# Patient Record
Sex: Female | Born: 1945 | Race: Black or African American | Hispanic: No | Marital: Married | Smoking: Never smoker
Health system: Southern US, Community
[De-identification: ages and names within clinical notes are randomized; demographics above are authoritative.]

## PROBLEM LIST (undated history)

## (undated) DIAGNOSIS — I10 Essential (primary) hypertension: Secondary | ICD-10-CM

## (undated) DIAGNOSIS — E785 Hyperlipidemia, unspecified: Secondary | ICD-10-CM

## (undated) DIAGNOSIS — R011 Cardiac murmur, unspecified: Secondary | ICD-10-CM

## (undated) DIAGNOSIS — E119 Type 2 diabetes mellitus without complications: Secondary | ICD-10-CM

## (undated) DIAGNOSIS — J45909 Unspecified asthma, uncomplicated: Secondary | ICD-10-CM

## (undated) DIAGNOSIS — K649 Unspecified hemorrhoids: Secondary | ICD-10-CM

## (undated) DIAGNOSIS — M8580 Other specified disorders of bone density and structure, unspecified site: Secondary | ICD-10-CM

## (undated) DIAGNOSIS — N189 Chronic kidney disease, unspecified: Secondary | ICD-10-CM

## (undated) HISTORY — PX: BREAST SURGERY: SHX581

## (undated) HISTORY — PX: BREAST CYST EXCISION: SHX579

## (undated) HISTORY — PX: ABDOMINAL HYSTERECTOMY: SHX81

## (undated) HISTORY — PX: COLONOSCOPY: SHX174

---

## 2005-02-01 ENCOUNTER — Ambulatory Visit: Payer: Self-pay

## 2006-03-13 ENCOUNTER — Ambulatory Visit: Payer: Self-pay | Admitting: Family Medicine

## 2007-01-16 ENCOUNTER — Ambulatory Visit: Payer: Self-pay

## 2008-04-22 ENCOUNTER — Ambulatory Visit: Payer: Self-pay

## 2008-09-08 ENCOUNTER — Ambulatory Visit: Payer: Self-pay | Admitting: Family Medicine

## 2009-02-17 ENCOUNTER — Ambulatory Visit: Payer: Self-pay | Admitting: Family Medicine

## 2009-04-23 ENCOUNTER — Ambulatory Visit: Payer: Self-pay | Admitting: Family Medicine

## 2010-04-25 ENCOUNTER — Ambulatory Visit: Payer: Self-pay | Admitting: Family Medicine

## 2010-05-04 ENCOUNTER — Ambulatory Visit: Payer: Self-pay | Admitting: Family Medicine

## 2010-12-30 ENCOUNTER — Ambulatory Visit: Payer: Self-pay | Admitting: Internal Medicine

## 2011-02-24 ENCOUNTER — Ambulatory Visit: Payer: Self-pay | Admitting: Internal Medicine

## 2011-04-27 ENCOUNTER — Ambulatory Visit: Payer: Self-pay | Admitting: Internal Medicine

## 2011-05-14 ENCOUNTER — Inpatient Hospital Stay: Payer: Self-pay | Admitting: Internal Medicine

## 2011-05-26 ENCOUNTER — Ambulatory Visit: Payer: Self-pay | Admitting: Internal Medicine

## 2012-01-31 ENCOUNTER — Ambulatory Visit: Payer: Self-pay | Admitting: Family Medicine

## 2013-02-05 ENCOUNTER — Ambulatory Visit: Payer: Self-pay | Admitting: Family Medicine

## 2013-05-09 DIAGNOSIS — E669 Obesity, unspecified: Secondary | ICD-10-CM | POA: Insufficient documentation

## 2013-05-09 DIAGNOSIS — J45909 Unspecified asthma, uncomplicated: Secondary | ICD-10-CM | POA: Insufficient documentation

## 2013-05-09 DIAGNOSIS — J309 Allergic rhinitis, unspecified: Secondary | ICD-10-CM | POA: Insufficient documentation

## 2013-05-09 DIAGNOSIS — H209 Unspecified iridocyclitis: Secondary | ICD-10-CM | POA: Insufficient documentation

## 2013-05-09 DIAGNOSIS — N952 Postmenopausal atrophic vaginitis: Secondary | ICD-10-CM | POA: Insufficient documentation

## 2013-05-09 DIAGNOSIS — M8580 Other specified disorders of bone density and structure, unspecified site: Secondary | ICD-10-CM | POA: Insufficient documentation

## 2013-05-29 ENCOUNTER — Ambulatory Visit: Payer: Self-pay | Admitting: Family Medicine

## 2013-06-10 DIAGNOSIS — R7303 Prediabetes: Secondary | ICD-10-CM | POA: Insufficient documentation

## 2013-11-13 ENCOUNTER — Ambulatory Visit: Payer: Self-pay | Admitting: Unknown Physician Specialty

## 2014-02-25 ENCOUNTER — Ambulatory Visit: Payer: Self-pay | Admitting: Family Medicine

## 2014-03-06 DIAGNOSIS — R011 Cardiac murmur, unspecified: Secondary | ICD-10-CM | POA: Insufficient documentation

## 2014-03-10 DIAGNOSIS — J4530 Mild persistent asthma, uncomplicated: Secondary | ICD-10-CM | POA: Insufficient documentation

## 2014-04-07 DIAGNOSIS — K649 Unspecified hemorrhoids: Secondary | ICD-10-CM | POA: Insufficient documentation

## 2015-02-20 ENCOUNTER — Ambulatory Visit: Payer: Self-pay | Admitting: Family Medicine

## 2015-03-08 DIAGNOSIS — E010 Iodine-deficiency related diffuse (endemic) goiter: Secondary | ICD-10-CM | POA: Insufficient documentation

## 2015-03-08 DIAGNOSIS — N183 Chronic kidney disease, stage 3 unspecified: Secondary | ICD-10-CM | POA: Insufficient documentation

## 2015-03-17 ENCOUNTER — Ambulatory Visit: Payer: Self-pay | Admitting: Family Medicine

## 2015-04-07 ENCOUNTER — Ambulatory Visit
Admit: 2015-04-07 | Disposition: A | Discharge status: Home / Self Care | Payer: Self-pay | Attending: Family Medicine | Admitting: Family Medicine

## 2015-09-10 ENCOUNTER — Other Ambulatory Visit: Payer: Self-pay | Admitting: Family Medicine

## 2015-09-10 DIAGNOSIS — M8580 Other specified disorders of bone density and structure, unspecified site: Secondary | ICD-10-CM

## 2016-02-10 ENCOUNTER — Other Ambulatory Visit: Payer: Self-pay | Admitting: Family Medicine

## 2016-02-10 DIAGNOSIS — Z1231 Encounter for screening mammogram for malignant neoplasm of breast: Secondary | ICD-10-CM

## 2016-02-12 ENCOUNTER — Ambulatory Visit
Admission: EM | Admit: 2016-02-12 | Discharge: 2016-02-12 | Disposition: A | Discharge status: Home / Self Care | Payer: Medicare HMO | Attending: Family Medicine | Admitting: Family Medicine

## 2016-02-12 ENCOUNTER — Encounter: Payer: Self-pay | Admitting: Gynecology

## 2016-02-12 DIAGNOSIS — H6992 Unspecified Eustachian tube disorder, left ear: Secondary | ICD-10-CM

## 2016-02-12 DIAGNOSIS — H6982 Other specified disorders of Eustachian tube, left ear: Secondary | ICD-10-CM

## 2016-02-12 DIAGNOSIS — J302 Other seasonal allergic rhinitis: Secondary | ICD-10-CM | POA: Diagnosis not present

## 2016-02-12 HISTORY — DX: Essential (primary) hypertension: I10

## 2016-02-12 HISTORY — DX: Unspecified hemorrhoids: K64.9

## 2016-02-12 HISTORY — DX: Cardiac murmur, unspecified: R01.1

## 2016-02-12 HISTORY — DX: Unspecified asthma, uncomplicated: J45.909

## 2016-02-12 HISTORY — DX: Type 2 diabetes mellitus without complications: E11.9

## 2016-02-12 HISTORY — DX: Other specified disorders of bone density and structure, unspecified site: M85.80

## 2016-02-12 HISTORY — DX: Hyperlipidemia, unspecified: E78.5

## 2016-02-12 MED ORDER — CETIRIZINE-PSEUDOEPHEDRINE ER 5-120 MG PO TB12: 1.0000 | ORAL_TABLET | Freq: Two times a day (BID) | ORAL | Status: DC

## 2016-02-12 NOTE — Discharge Instructions (Signed)
Hay Fever  Hay fever is a type of allergy that people have to things like grass, animals, or pollen from plants and flowers. It cannot be passed from one person to another. You cannot cure hay fever, but there are things that may help relieve your problems (symptoms). HOME CARE  Avoid the things that may be causing your problems.  Take all medicine as told by your doctor. GET HELP RIGHT AWAY IF:  You have asthma, a cough, and you start making whistling sounds when breathing (wheezing).  Your tongue or lips are puffy (swollen).  You have trouble breathing.  You feel lightheaded or like you will pass out (faint).  You have a fever.  Your problems are getting worse and your medicine is not helping.  Your treatment was working, but your problems have come back.  You are stuffed up (congested) and have pressure in your face.  You have a headache.  You have cold sweats. MAKE SURE YOU:  Understand these instructions.  Will watch your condition.  Will get help right away if you are not doing well or get worse.   This information is not intended to replace advice given to you by your health care provider. Make sure you discuss any questions you have with your health care provider.   Document Released: 04/12/2011 Document Revised: 03/04/2012 Document Reviewed: 06/23/2015 Elsevier Interactive Patient Education [employer]  Allergies An allergy is when your body reacts to a substance in a way that is not normal. An allergic reaction can happen after you:  Eat something.  Breathe in something.  Touch something. WHAT KINDS OF ALLERGIES ARE THERE? You can be allergic to:  Things that are only around during certain seasons, like molds and pollens.  Foods.  Drugs.  Insects.  Animal dander. WHAT ARE SYMPTOMS OF ALLERGIES?  Puffiness (swelling). This may happen on the lips, face, tongue, mouth, or throat.  Sneezing.  Coughing.  Breathing loudly  (wheezing).  Stuffy nose.  Tingling in the mouth.  A rash.  Itching.  Itchy, red, puffy areas of skin (hives).  Watery eyes.  Throwing up (vomiting).  Watery poop (diarrhea).  Dizziness.  Feeling faint or fainting.  Trouble breathing or swallowing.  A tight feeling in the chest.  A fast heartbeat. HOW ARE ALLERGIES DIAGNOSED? Allergies can be diagnosed with:  A medical and family history.  Skin tests.  Blood tests.  A food diary. A food diary is a record of all the foods, drinks, and symptoms you have each day.  The results of an elimination diet. This diet involves making sure not to eat certain foods and then seeing what happens when you start eating them again. HOW ARE ALLERGIES TREATED? There is no cure for allergies, but allergic reactions can be treated with medicine. Severe reactions usually need to be treated at a hospital.  HOW CAN REACTIONS BE PREVENTED? The best way to prevent an allergic reaction is to avoid the thing you are allergic to. Allergy shots and medicines can also help prevent reactions in some cases.   This information is not intended to replace advice given to you by your health care provider. Make sure you discuss any questions you have with your health care provider.   Document Released: 04/07/2013 Document Revised: 01/01/2015 Document Reviewed: 09/22/2014 Elsevier Interactive Patient Education 2016 [employer] Barotitis media is inflammation of your middle ear. This occurs when the auditory tube (eustachian tube) leading from the back of your nose (nasopharynx)  to your eardrum is blocked. This blockage may result from a cold, environmental allergies, or an upper respiratory infection. Unresolved barotitis media may lead to damage or hearing loss (barotrauma), which may become permanent. HOME CARE INSTRUCTIONS   Use medicines as recommended by your health care provider. Over-the-counter medicines will help unblock the  canal and can help during times of air travel.  Do not put anything into your ears to clean or unplug them. Eardrops will not be helpful.  Do not swim, dive, or fly until your health care provider says it is all right to do so. If these activities are necessary, chewing gum with frequent, forceful swallowing may help. It is also helpful to hold your nose and gently blow to pop your ears for equalizing pressure changes. This forces air into the eustachian tube.  Only take over-the-counter or prescription medicines for pain, discomfort, or fever as directed by your health care provider.  A decongestant may be helpful in decongesting the middle ear and make pressure equalization easier. SEEK MEDICAL CARE IF:  You experience a serious form of dizziness in which you feel as if the room is spinning and you feel nauseated (vertigo).  Your symptoms only involve one ear. SEEK IMMEDIATE MEDICAL CARE IF:   You develop a severe headache, dizziness, or severe ear pain.  You have bloody or pus-like drainage from your ears.  You develop a fever.  Your problems do not improve or become worse. MAKE SURE YOU:   Understand these instructions.  Will watch your condition.  Will get help right away if you are not doing well or get worse.   This information is not intended to replace advice given to you by your health care provider. Make sure you discuss any questions you have with your health care provider.   Document Released: 12/08/2000 Document Revised: 10/01/2013 Document Reviewed: 07/08/2013 Elsevier Interactive Patient Education [employer]

## 2016-02-12 NOTE — ED Provider Notes (Signed)
CSN: [id number]     Arrival date & time 02/12/16  1323 History   First MD Initiated Contact with Patient 02/12/16 1555    Nurses notes were reviewed. Chief Complaint  Patient presents with  . Otalgia    Patient reports the last 3 days pressure behind left ear. She states that the sinus pain pressure. She's has trouble with allergies was concerned about increased waxing and infection of the left ear. She denies any other problems. She's has no fever. She takes Zyrtec and she has Flonase but hasn't helped that much. She does have a history of hypertension diabetes and cardiac murmur hyperlipidemia. She's had breast surgery abdominal hysterectomy and no stiff Family medical history or problems. Patient does not smoke.   (Consider location/radiation/quality/duration/timing/severity/associated sxs/prior Treatment) Patient is a 70 y.o. female presenting with ear pain. The history is provided by the patient. No language interpreter was used.  Otalgia Location:  Left Quality:  Pressure Severity:  Moderate Onset quality:  Sudden Duration:  3 days Progression:  Worsening Chronicity:  New Context: loud noise   Context: not direct blow, not elevation change and not foreign body in ear   Relieved by:  Nothing Associated symptoms: neck pain     Past Medical History  Diagnosis Date  . Hypertension   . Diabetes mellitus without complication (HCC)   . Asthma   . Osteopenia   . Murmur, cardiac   . Hyperlipidemia   . Hemorrhoids    Past Surgical History  Procedure Laterality Date  . Breast surgery    . Abdominal hysterectomy     No family history on file. Social History  Substance Use Topics  . Smoking status: Never Smoker   . Smokeless tobacco: None  . Alcohol Use: No   OB History    No data available     Review of Systems  HENT: Positive for ear pain.   Musculoskeletal: Positive for neck pain.  All other systems reviewed and are negative.   Allergies  Amlodipine;  Atorvastatin; Penicillins; Rosuvastatin; and Sulfa antibiotics  Home Medications   Prior to Admission medications   Medication Sig Start Date End Date Taking? Authorizing Provider  albuterol (PROVENTIL) (2.5 MG/3ML) 0.083% nebulizer solution Take 2.5 mg by nebulization every 6 (six) hours as needed for wheezing or shortness of breath.   Yes Historical Provider, MD  aspirin 81 MG tablet Take 81 mg by mouth daily.   Yes Historical Provider, MD  beclomethasone (QVAR) 80 MCG/ACT inhaler Inhale into the lungs 2 (two) times daily.   Yes Historical Provider, MD  carboxymethylcellulose (REFRESH PLUS) 0.5 % SOLN 1 drop 3 (three) times daily as needed.   Yes Historical Provider, MD  cetirizine (ZYRTEC) 10 MG tablet Take 10 mg by mouth daily.   Yes Historical Provider, MD  desoximetasone (TOPICORT) 0.25 % cream Apply 1 application topically 2 (two) times daily.   Yes Historical Provider, MD  ferrous sulfate 325 (65 FE) MG EC tablet Take 325 mg by mouth 3 (three) times daily with meals.   Yes Historical Provider, MD  fluticasone (FLONASE) 50 MCG/ACT nasal spray Place into both nostrils daily.   Yes Historical Provider, MD  losartan-hydrochlorothiazide (HYZAAR) 100-12.5 MG tablet Take 1 tablet by mouth daily.   Yes Historical Provider, MD  montelukast (SINGULAIR) 10 MG tablet Take 10 mg by mouth at bedtime.   Yes Historical Provider, MD  Multiple Vitamin (MULTIVITAMIN) capsule Take 1 capsule by mouth daily.   Yes Historical Provider, MD  simvastatin (ZOCOR)  20 MG tablet Take 20 mg by mouth daily.   Yes Historical Provider, MD  tetrahydrozoline (VISINE) 0.05 % ophthalmic solution    Yes Historical Provider, MD   Meds Ordered and Administered this Visit  Medications - No data to display  BP 151/78 mmHg  Pulse 75  Temp(Src) 97.8 F (36.6 C) (Oral)  Resp 18  Ht  (1.6 m)  Wt 214 lb (97.07 kg)  BMI 37.92 kg/m2  SpO2 98% No data found.   Physical Exam  Constitutional: She is oriented to person,  place, and time. She appears well-developed and well-nourished.  HENT:  Head: Atraumatic. Macrocephalic.  Right Ear: Tympanic membrane, external ear and ear canal normal. No decreased hearing is noted.  Left Ear: Hearing, tympanic membrane, external ear and ear canal normal.  Nose: Mucosal edema present. No rhinorrhea. Right sinus exhibits no maxillary sinus tenderness and no frontal sinus tenderness. Left sinus exhibits no maxillary sinus tenderness and no frontal sinus tenderness.  Mouth/Throat: Oropharynx is clear and moist. Normal dentition.  Eyes: Pupils are equal, round, and reactive to light.  Neck: Normal range of motion. Neck supple.  Musculoskeletal: Normal range of motion.  Neurological: She is alert and oriented to person, place, and time. She has normal reflexes.  Skin: Skin is warm and dry. No erythema.  Psychiatric: She has a normal mood and affect.  Vitals reviewed.   ED Course  Procedures (including critical care time)  Labs Review Labs Reviewed - No data to display  Imaging Review No results found.   Visual Acuity Review  Right Eye Distance:   Left Eye Distance:   Bilateral Distance:    Right Eye Near:   Left Eye Near:    Bilateral Near:         MDM  No diagnosis found. Discussed patient about using Valsalva maneuver open up the left ear. We'll switch her from Zyrtec to Zyrtec-D she has Flonase at home but instructed her how to use the Flonase nasal spray in a more efficacious way to help the eustachian tube dysfunction. Informed her she should not be taste the Flonase. Explained to her that if the next step probably be placed on a steroid medication which she really does not want to have.  Note: This dictation was prepared with Dragon dictation along with smaller phrase technology. Any transcriptional errors that result from this process are unintentional.  Hassan Rowan, MD 02/12/16 [id number]

## 2016-02-12 NOTE — ED Notes (Signed)
Patient c/o left ear pain on and off. Patient also stated at times muscle ache.

## 2016-04-04 ENCOUNTER — Other Ambulatory Visit: Payer: Self-pay | Admitting: Family Medicine

## 2016-04-04 DIAGNOSIS — Z1231 Encounter for screening mammogram for malignant neoplasm of breast: Secondary | ICD-10-CM

## 2016-04-11 ENCOUNTER — Ambulatory Visit
Admission: RE | Admit: 2016-04-11 | Discharge: 2016-04-11 | Disposition: A | Discharge status: Home / Self Care | Payer: Medicare HMO | Source: Ambulatory Visit | Attending: Family Medicine | Admitting: Family Medicine

## 2016-04-11 DIAGNOSIS — Z1231 Encounter for screening mammogram for malignant neoplasm of breast: Secondary | ICD-10-CM | POA: Insufficient documentation

## 2016-04-11 DIAGNOSIS — M8580 Other specified disorders of bone density and structure, unspecified site: Secondary | ICD-10-CM

## 2016-04-11 DIAGNOSIS — Z780 Asymptomatic menopausal state: Secondary | ICD-10-CM | POA: Diagnosis not present

## 2016-10-31 ENCOUNTER — Ambulatory Visit
Admission: EM | Admit: 2016-10-31 | Discharge: 2016-10-31 | Disposition: A | Discharge status: Home / Self Care | Payer: Medicare HMO | Attending: Family Medicine | Admitting: Family Medicine

## 2016-10-31 DIAGNOSIS — J40 Bronchitis, not specified as acute or chronic: Secondary | ICD-10-CM

## 2016-10-31 DIAGNOSIS — J0100 Acute maxillary sinusitis, unspecified: Secondary | ICD-10-CM | POA: Diagnosis not present

## 2016-10-31 MED ORDER — DOXYCYCLINE HYCLATE 100 MG PO CAPS: 100.0000 mg | ORAL_CAPSULE | Freq: Two times a day (BID) | ORAL | 0 refills | Status: DC

## 2016-10-31 NOTE — ED Triage Notes (Addendum)
Pt c/o nasal drainage, and cold like symptoms. Coughing up yellow mucus. She was dx with COPD about 5 years ago.

## 2016-10-31 NOTE — Discharge Instructions (Signed)
Take medication as prescribed. Rest. Drink plenty of fluids.  ° °Follow up with your primary care physician this week as needed. Return to Urgent care for new or worsening concerns.  ° °

## 2016-10-31 NOTE — ED Provider Notes (Signed)
[facility] ____________________________________________  Time seen: Approximately 1755 PM  I have reviewed the triage vital signs and the nursing notes.   HISTORY  Chief Complaint Recurrent Sinusitis   HPI Becky Reese is a 70 y.o. female presents for the complaints of 2 weeks of runny nose, nasal congestion, sinus pressure and intermittent cough. Patient reports cough is been more so in the last few days occasionally productive with yellowish mucus. Patient reports has been intermittently using saline nasal rinses that helps some with congestion but no resolution.   Denies known sick contacts. Denies fevers. States still with some sinus pressure around her cheek bones. Denies any dizziness, extremity pain, extremity swelling,vision changes, chest pain, shortness of breath. Denies wheezing. Denies recent antibiotic use. Patient reports feels well otherwise. Reports has continued to remain active.  Reports chronic renal insufficiency and diabetes as well as reports history of COPD. Denies dialysis.  [facility]: PCP   Past Medical History:  Diagnosis Date  . Asthma   . Diabetes mellitus without complication (HCC)   . Hemorrhoids   . Hyperlipidemia   . Hypertension   . Murmur, cardiac   . Osteopenia     There are no active problems to display for this patient.   Past Surgical History:  Procedure Laterality Date  . ABDOMINAL HYSTERECTOMY     PARTIAL  . BREAST CYST EXCISION Right    NEG  . BREAST SURGERY      Current Outpatient Rx  . Order #: [id number] Class: Historical Med  . Order #: [id number] Class: Historical Med  . Order #: [id number] Class: Historical Med  . Order #: [id number] Class: Historical Med  . Order #: [id number] Class: Historical Med  . Order #: [id number] Class: Normal  . Order #: [id number] Class: Historical Med  . Order #: [id number] Class: Historical Med  . Order #: [id number] Class: Historical Med  . Order #: [id number] Class:  Historical Med  . Order #: [id number] Class: Historical Med  . Order #: [id number] Class: Historical Med  . Order #: [id number] Class: Historical Med  . Order #: [id number] Class: Historical Med  . Order #: [id number] Class: Normal    No current facility-administered medications for this encounter.   Current Outpatient Prescriptions:  .  albuterol (PROVENTIL) (2.5 MG/3ML) 0.083% nebulizer solution, Take 2.5 mg by nebulization every 6 (six) hours as needed for wheezing or shortness of breath., Disp: , Rfl:  .  aspirin 81 MG tablet, Take 81 mg by mouth daily., Disp: , Rfl:  .  beclomethasone (QVAR) 80 MCG/ACT inhaler, Inhale into the lungs 2 (two) times daily., Disp: , Rfl:  .  carboxymethylcellulose (REFRESH PLUS) 0.5 % SOLN, 1 drop 3 (three) times daily as needed., Disp: , Rfl:  .  cetirizine (ZYRTEC) 10 MG tablet, Take 10 mg by mouth daily., Disp: , Rfl:  .  cetirizine-pseudoephedrine (ZYRTEC-D) 5-120 MG tablet, Take 1 tablet by mouth 2 (two) times daily., Disp: 60 tablet, Rfl: 0 .  desoximetasone (TOPICORT) 0.25 % cream, Apply 1 application topically 2 (two) times daily., Disp: , Rfl:  .  ferrous sulfate 325 (65 FE) MG EC tablet, Take 325 mg by mouth 3 (three) times daily with meals., Disp: , Rfl:  .  fluticasone (FLONASE) 50 MCG/ACT nasal spray, Place into both nostrils daily., Disp: , Rfl:  .  losartan-hydrochlorothiazide (HYZAAR) 100-12.5 MG tablet, Take 1 tablet by mouth daily., Disp: , Rfl:  .  montelukast (SINGULAIR) 10 MG tablet, Take 10 mg by mouth at bedtime., Disp: , Rfl:  .  Multiple Vitamin (MULTIVITAMIN)  capsule, Take 1 capsule by mouth daily., Disp: , Rfl:  .  simvastatin (ZOCOR) 20 MG tablet, Take 20 mg by mouth daily., Disp: , Rfl:  .  tetrahydrozoline (VISINE) 0.05 % ophthalmic solution, , Disp: , Rfl:  .  doxycycline (VIBRAMYCIN) 100 MG capsule, Take 1 capsule (100 mg total) by mouth 2 (two) times daily., Disp: 20 capsule, Rfl: 0  Allergies Amlodipine; Atorvastatin; Penicillins;  Rosuvastatin; and Sulfa antibiotics  Family History  Problem Relation Age of Onset  . Cancer Mother   . Cancer Father     Social History Social History  Substance Use Topics  . Smoking status: Never Smoker  . Smokeless tobacco: Never Used  . Alcohol use No    Review of Systems Constitutional: No fever/chills Eyes: No visual changes. ENT: No sore throat.As above. Cardiovascular: Denies chest pain. Respiratory: Denies shortness of breath. Gastrointestinal: No abdominal pain.  No nausea, no vomiting.  No diarrhea.  No constipation. Genitourinary: Negative for dysuria. Musculoskeletal: Negative for back pain. Skin: Negative for rash. Neurological: Negative for headaches, focal weakness or numbness.  10-point ROS otherwise negative.  ____________________________________________   PHYSICAL EXAM:  VITAL SIGNS: ED Triage Vitals  Enc Vitals Group     BP 10/31/16 1721 (!) 163/56     Pulse Rate 10/31/16 1721 97     Resp 10/31/16 1721 18     Temp 10/31/16 1721 98 F (36.7 C)     Temp Source 10/31/16 1721 Oral     SpO2 10/31/16 1721 100 %     Weight --      Height --      Head Circumference --      Peak Flow --      Pain Score 10/31/16 1720 5     Pain Loc --      Pain Edu? --      Excl. in GC? --    Constitutional: Alert and oriented. Well appearing and in no acute distress. Eyes: Conjunctivae are normal. PERRL. EOMI. Head: Atraumatic.Mild to moderate tenderness to palpation bilateral maxillary sinuses. No frontal sinus tenderness. No swelling. No erythema.   Ears: no erythema, normal TMs bilaterally.   Nose: nasal congestion with bilateral nasal turbinate erythema and edema.   Mouth/Throat: Mucous membranes are moist.  Oropharynx non-erythematous.No tonsillar swelling or exudate.  Neck: No stridor.  No cervical spine tenderness to palpation. Hematological/Lymphatic/Immunilogical: No cervical lymphadenopathy. Cardiovascular: Normal rate, regular rhythm. Grossly normal  heart sounds.  Good peripheral circulation. Respiratory: Normal respiratory effort.  No retractions. Lungs CTAB. No wheezes, rales or rhonchi. Good air movement. Occasional dry cough noted in room. No focal area of consolidation auscultated. Gastrointestinal: Soft and nontender. No distention.  Musculoskeletal: No lower or upper extremity tenderness nor edema.  Bilateral pedal pulses equal and easily palpated. No cervical, thoracic or lumbar tenderness to palpation.  Neurologic:  Normal speech and language. No gross focal neurologic deficits are appreciated. No gait instability. Skin:  Skin is warm, dry and intact. No rash noted. Psychiatric: Mood and affect are normal. Speech and behavior are normal.  ___________________________________________   LABS (all labs ordered are listed, but only abnormal results are displayed)  Labs Reviewed - No data to display ____________________________________________    PROCEDURES Procedures   INITIAL IMPRESSION / ASSESSMENT AND PLAN / ED COURSE  Pertinent labs & imaging results that were available during my care of the patient were reviewed by me and considered in my medical decision making (see chart for details).  Well-appearing patient. No  acute distress. Suspect maxillary sinusitis and bronchitis. Discussed in detail and encouraged supportive care. Via Epic, last creatinine 1.5 in October 2017. Will treat patient with oral doxycycline. Patient reports she has taken doxycycline in the past and tolerated well.   Discussed follow up with Primary care physician this week. Discussed follow up and return parameters including no resolution or any worsening concerns. Patient verbalized understanding and agreed to plan.   ____________________________________________   FINAL CLINICAL IMPRESSION(S) / ED DIAGNOSES  Final diagnoses:  Acute maxillary sinusitis, recurrence not specified  Bronchitis     Discharge Medication List as of 10/31/2016  5:59  PM    START taking these medications   Details  doxycycline (VIBRAMYCIN) 100 MG capsule Take 1 capsule (100 mg total) by mouth 2 (two) times daily., Starting Tue 10/31/2016, Normal        Note: This dictation was prepared with Dragon dictation along with smaller phrase technology. Any transcriptional errors that result from this process are unintentional.    Clinical Course       Renford Dills, NP 10/31/16 4098

## 2016-11-13 ENCOUNTER — Ambulatory Visit
Admission: EM | Admit: 2016-11-13 | Discharge: 2016-11-13 | Disposition: A | Discharge status: Home / Self Care | Payer: Medicare HMO | Attending: Family Medicine | Admitting: Family Medicine

## 2016-11-13 ENCOUNTER — Ambulatory Visit (INDEPENDENT_AMBULATORY_CARE_PROVIDER_SITE_OTHER): Payer: Medicare HMO

## 2016-11-13 DIAGNOSIS — J0100 Acute maxillary sinusitis, unspecified: Secondary | ICD-10-CM

## 2016-11-13 DIAGNOSIS — J40 Bronchitis, not specified as acute or chronic: Secondary | ICD-10-CM

## 2016-11-13 MED ORDER — LEVOFLOXACIN 500 MG PO TABS: ORAL_TABLET | ORAL | 0 refills | Status: DC

## 2016-11-13 MED ORDER — PREDNISONE 10 MG PO TABS: ORAL_TABLET | ORAL | 0 refills | Status: DC

## 2016-11-13 NOTE — ED Provider Notes (Signed)
[facility] ____________________________________________  Time seen: Approximately 11:17 AM  I have reviewed the triage vital signs and the nursing notes.   HISTORY  Chief Complaint Cough   HPI Becky Reese is a 70 y.o. female presents with complaints of approximately one month cough, runny nose and nasal congestion. Patient reports that she was seen personally 2 weeks ago in urgent care for the same complaints. Patient reports she took oral doxycycline for sinusitis and bronchitis. Patient reports the antibiotic did help and she was feeling better, but reports symptoms have still continue to not fully resolved. Patient reports that her cough is still improved as well as the nasal congestion is still improved. Patient states the cough is primarily at night now. Patient does report some postnasal drainage and continued sinus pressure around her cheeks. Reports really blowing her nose and thick drainage out. States cough is mostly dry cough. Patient reports that her lungs do still feel congested. Patient also does report some intermittent wheezing, more so at night.  Denies fevers. Reports overall continues to remain active. Reports continues to eat and drink well. Denies insect contacts. Patient again states that overall she is feeling better but symptoms are not resolved. Denies chest pain, shortness of breath, just a deep breath, extremity pain, actually swelling, dizziness, weakness or other complaints. Patient reports chronic renal insufficiency. Denies cardiac history.  [facility]: PCP   Past Medical History:  Diagnosis Date  . Asthma   . Diabetes mellitus without complication (HCC)   . Hemorrhoids   . Hyperlipidemia   . Hypertension   . Murmur, cardiac   . Osteopenia     There are no active problems to display for this patient.   Past Surgical History:  Procedure Laterality Date  . ABDOMINAL HYSTERECTOMY     PARTIAL  . BREAST CYST EXCISION  Right    NEG  . BREAST SURGERY      No current facility-administered medications for this encounter.   Current Outpatient Prescriptions:  .  albuterol (PROVENTIL) (2.5 MG/3ML) 0.083% nebulizer solution, Take 2.5 mg by nebulization every 6 (six) hours as needed for wheezing or shortness of breath., Disp: , Rfl:  .  aspirin 81 MG tablet, Take 81 mg by mouth daily., Disp: , Rfl:  .  beclomethasone (QVAR) 80 MCG/ACT inhaler, Inhale into the lungs 2 (two) times daily., Disp: , Rfl:  .  carboxymethylcellulose (REFRESH PLUS) 0.5 % SOLN, 1 drop 3 (three) times daily as needed., Disp: , Rfl:  .  cetirizine (ZYRTEC) 10 MG tablet, Take 10 mg by mouth daily., Disp: , Rfl:  .  cetirizine-pseudoephedrine (ZYRTEC-D) 5-120 MG tablet, Take 1 tablet by mouth 2 (two) times daily., Disp: 60 tablet, Rfl: 0 .  desoximetasone (TOPICORT) 0.25 % cream, Apply 1 application topically 2 (two) times daily., Disp: , Rfl:  .  ferrous sulfate 325 (65 FE) MG EC tablet, Take 325 mg by mouth 3 (three) times daily with meals., Disp: , Rfl:  .  losartan-hydrochlorothiazide (HYZAAR) 100-12.5 MG tablet, Take 1 tablet by mouth daily., Disp: , Rfl:  .  montelukast (SINGULAIR) 10 MG tablet, Take 10 mg by mouth at bedtime., Disp: , Rfl:  .  Multiple Vitamin (MULTIVITAMIN) capsule, Take 1 capsule by mouth daily., Disp: , Rfl:  .  simvastatin (ZOCOR) 20 MG tablet, Take 20 mg by mouth daily., Disp: , Rfl:  .  tetrahydrozoline (VISINE) 0.05 % ophthalmic solution, , Disp: , Rfl:  .  doxycycline (VIBRAMYCIN) 100 MG  capsule, Take 1 capsule (100 mg total) by mouth 2 (two) times daily., Disp: 20 capsule, Rfl: 0 .  fluticasone (FLONASE) 50 MCG/ACT nasal spray, Place into both nostrils daily., Disp: , Rfl:  .  levofloxacin (LEVAQUIN) 500 MG tablet, Take 500 mg tablet orally day one; then 250 mg orally days 2-7., Disp: 4 tablet, Rfl: 0 .  predniSONE (DELTASONE) 10 MG tablet, Start 60 mg po day one, then 50 mg po day two, taper by 10 mg daily  until complete., Disp: 21 tablet, Rfl: 0  Allergies Amlodipine; Atorvastatin; Penicillins; Rosuvastatin; and Sulfa antibiotics  Family History  Problem Relation Age of Onset  . Cancer Mother   . Cancer Father     Social History Social History  Substance Use Topics  . Smoking status: Never Smoker  . Smokeless tobacco: Never Used  . Alcohol use No    Review of Systems Constitutional: No fever/chills Eyes: No visual changes. ENT: No sore throat. As above.  Cardiovascular: Denies chest pain. Respiratory: Denies shortness of breath. Gastrointestinal: No abdominal pain.  No nausea, no vomiting.  No diarrhea.  No constipation. Genitourinary: Negative for dysuria. Musculoskeletal: Negative for back pain. Skin: Negative for rash. Neurological: Negative for headaches, focal weakness or numbness.  10-point ROS otherwise negative.  ____________________________________________   PHYSICAL EXAM:  VITAL SIGNS: ED Triage Vitals  Enc Vitals Group     BP 11/13/16 1056 (!) 109/54     Pulse Rate 11/13/16 1056 72     Resp 11/13/16 1056 17     Temp 11/13/16 1056 97.7 F (36.5 C)     Temp Source 11/13/16 1056 Tympanic     SpO2 11/13/16 1056 100 %     Weight 11/13/16 1052 200 lb (90.7 kg)     Height 11/13/16 1052 5\' 3"  (1.6 m)     Head Circumference --      Peak Flow --      Pain Score 11/13/16 1054 0     Pain Loc --      Pain Edu? --      Excl. in GC? --     Constitutional: Alert and oriented. Well appearing and in no acute distress. Eyes: Conjunctivae are normal. PERRL. EOMI. Head: Atraumatic.Mild tenderness to palpation bilateral maxillary sinuses; no frontal sinus tenderness to palpation. No swelling. No erythema.   Ears: no erythema, normal TMs bilaterally.   Nose: nasal congestion with bilateral nasal turbinate erythema and edema.   Mouth/Throat: Mucous membranes are moist.  Oropharynx non-erythematous.No tonsillar swelling or exudate.  Neck: No stridor.  No cervical  spine tenderness to palpation. Hematological/Lymphatic/Immunilogical: No cervical lymphadenopathy. Cardiovascular: Normal rate, regular rhythm. Grossly normal heart sounds.  Good peripheral circulation. Respiratory: Normal respiratory effort.  No retractions. Lungs CTAB. No wheezes, rales or rhonchi. Good air movement. Dry intermittent cough noted in room, mild bronchospasm wheeze noted with cough.  Gastrointestinal: Soft and nontender. No distention.  Musculoskeletal: No lower or upper extremity tenderness nor edema.  Bilateral pedal pulses equal and easily palpated. No cervical, thoracic or lumbar tenderness to palpation.  Neurologic:  Normal speech and language. No gross focal neurologic deficits are appreciated. No gait instability. Skin:  Skin is warm, dry and intact. No rash noted. Psychiatric: Mood and affect are normal. Speech and behavior are normal.  ___________________________________________   LABS (all labs ordered are listed, but only abnormal results are displayed)  Labs Reviewed - No data to display ____________________________________________  RADIOLOGY  Dg Chest 2 View  Result Date: 11/13/2016 CLINICAL  DATA:  Cough, congestion EXAM: CHEST  2 VIEW COMPARISON:  05/14/2011 FINDINGS: Cardiomediastinal silhouette is stable. No acute infiltrate or pleural effusion. No pulmonary edema. Mild degenerative changes mid thoracic spine. IMPRESSION: No active cardiopulmonary disease. Electronically Signed   By: Natasha Mead M.D.   On: 11/13/2016 11:49   ____________________________________________   PROCEDURES Procedures   INITIAL IMPRESSION / ASSESSMENT AND PLAN / ED COURSE  Pertinent labs & imaging results that were available during my care of the patient were reviewed by me and considered in my medical decision making (see chart for details).  Well-appearing patient. No acute distress. Patient reports has improved but symptoms continue. Suspect continued bronchitis and  sinusitis. However his continued cough will evaluate chest x-ray. Per radiologist's chest x-ray negative. Discussed in detail with patient we'll treat with oral prednisone. Patient does have history of renal insufficiency and last labs reviewed via Epic. Will treat patient with oral Levaquin renal dose, 500 mg day 1 and 250 mg days 2 through 7. Encouraged supportive care. Continue home albuterol inhaler as needed. Encouraged PCP follow up in one week.Discussed indication, risks and benefits of medications with patient.  Discussed follow up with Primary care physician this week. Discussed follow up and return parameters including no resolution or any worsening concerns. Patient verbalized understanding and agreed to plan.   ____________________________________________   FINAL CLINICAL IMPRESSION(S) / ED DIAGNOSES  Final diagnoses:  Bronchitis  Acute maxillary sinusitis, recurrence not specified     Discharge Medication List as of 11/13/2016 12:00 PM    START taking these medications   Details  levofloxacin (LEVAQUIN) 500 MG tablet Take 500 mg tablet orally day one; then 250 mg orally days 2-7., Normal    predniSONE (DELTASONE) 10 MG tablet Start 60 mg po day one, then 50 mg po day two, taper by 10 mg daily until complete., Normal        Note: This dictation was prepared with Dragon dictation along with smaller phrase technology. Any transcriptional errors that result from this process are unintentional.    Clinical Course       Renford Dills, NP 11/13/16 1603

## 2016-11-13 NOTE — Discharge Instructions (Signed)
Take medication as prescribed. Rest. Drink plenty of fluids.  ° °Follow up with your primary care physician this week as needed. Return to Urgent care for new or worsening concerns.  ° °

## 2016-11-13 NOTE — ED Triage Notes (Signed)
Patient complains of cough that started initally over 1 month ago. Patient was seen here on 10/31/2016 and was treated with doxycycline. Patient states that she felt like she was improving but has worsened again. Patient states that she feels like her lungs are weak and would like to know if she should be taking prednisone.

## 2017-02-20 ENCOUNTER — Other Ambulatory Visit: Payer: Self-pay | Admitting: Family Medicine

## 2017-02-20 DIAGNOSIS — Z1231 Encounter for screening mammogram for malignant neoplasm of breast: Secondary | ICD-10-CM

## 2017-04-02 ENCOUNTER — Encounter: Payer: Self-pay | Admitting: Emergency Medicine

## 2017-04-02 ENCOUNTER — Ambulatory Visit
Admission: EM | Admit: 2017-04-02 | Discharge: 2017-04-02 | Disposition: A | Discharge status: Home / Self Care | Payer: Medicare HMO | Attending: Family Medicine | Admitting: Family Medicine

## 2017-04-02 DIAGNOSIS — J45901 Unspecified asthma with (acute) exacerbation: Secondary | ICD-10-CM | POA: Diagnosis not present

## 2017-04-02 DIAGNOSIS — J0100 Acute maxillary sinusitis, unspecified: Secondary | ICD-10-CM

## 2017-04-02 MED ORDER — PREDNISONE 10 MG PO TABS: ORAL_TABLET | ORAL | 0 refills | Status: DC

## 2017-04-02 MED ORDER — DOXYCYCLINE HYCLATE 100 MG PO CAPS: 100.0000 mg | ORAL_CAPSULE | Freq: Two times a day (BID) | ORAL | 0 refills | Status: DC

## 2017-04-02 MED ORDER — IPRATROPIUM-ALBUTEROL 0.5-2.5 (3) MG/3ML IN SOLN
3.0000 mL | Freq: Four times a day (QID) | RESPIRATORY_TRACT | Status: DC
  Administered 2017-04-02: 3 mL via RESPIRATORY_TRACT

## 2017-04-02 NOTE — ED Provider Notes (Signed)
[facility] ____________________________________________  Time seen: Approximately 3:00 PM  I have reviewed the triage vital signs and the nursing notes.   HISTORY  Chief Complaint Cough   HPI Becky Reese is a 71 y.o. female presenting evaluation of cough and chest congestion has been present for the last 3 weeks. Patient reports that initially she felt like she just had a cold, but reports she then felt she had more sinus inflammation and pressure that then causes drainage into her chest. Patient reports that she has been having intermittent wheezing for the last 2 weeks. Patient reports that she has had a chronic history of asthma that flares up often when she has colds, and current symptoms will consistent with asthma as well as previous bronchitis exacerbations. Patient reports that she does still have continued sinus pressure and discomfort around her cheekbones with thick nasal drainage. Reports some thick greenish mucus with cough. Reports has been using home albuterol inhaler with intermittent improvement of cough and wheezing sensation. Denies shortness of breath or chest pain. Reports had some chills at initial symptom onset, denies any other chills, body aches or feeling of fever.  Reports symptoms unresolved with over-the-counter cough and congestion medications. Reports overall feels well except for symptoms have continued. Denies pain at this time, but states sinus pressure sensation. Reports continues to eat and drink well. Denies sore throat.Denies chest pain, shortness of breath, abdominal pain, dysuria, extremity pain, extremity swelling or rash. Denies recent sickness. Denies recent antibiotic use. Patient reports that she does have a chronic renal insufficiency, with creatinine usually around 1.4 or 1.5 per patient.  [facility]: PCP   Past Medical History:  Diagnosis Date  . Asthma   . Diabetes mellitus without complication (HCC)   .  Hemorrhoids   . Hyperlipidemia   . Hypertension   . Murmur, cardiac   . Osteopenia     There are no active problems to display for this patient.   Past Surgical History:  Procedure Laterality Date  . ABDOMINAL HYSTERECTOMY     PARTIAL  . BREAST CYST EXCISION Right    NEG  . BREAST SURGERY       No current facility-administered medications for this encounter.   Current Outpatient Prescriptions:  .  albuterol (PROVENTIL) (2.5 MG/3ML) 0.083% nebulizer solution, Take 2.5 mg by nebulization every 6 (six) hours as needed for wheezing or shortness of breath., Disp: , Rfl:  .  aspirin 81 MG tablet, Take 81 mg by mouth daily., Disp: , Rfl:  .  beclomethasone (QVAR) 80 MCG/ACT inhaler, Inhale into the lungs 2 (two) times daily., Disp: , Rfl:  .  carboxymethylcellulose (REFRESH PLUS) 0.5 % SOLN, 1 drop 3 (three) times daily as needed., Disp: , Rfl:  .  cetirizine (ZYRTEC) 10 MG tablet, Take 10 mg by mouth daily., Disp: , Rfl:  .  cetirizine-pseudoephedrine (ZYRTEC-D) 5-120 MG tablet, Take 1 tablet by mouth 2 (two) times daily., Disp: 60 tablet, Rfl: 0 .  desoximetasone (TOPICORT) 0.25 % cream, Apply 1 application topically 2 (two) times daily., Disp: , Rfl:  .  doxycycline (VIBRAMYCIN) 100 MG capsule, Take 1 capsule (100 mg total) by mouth 2 (two) times daily., Disp: 20 capsule, Rfl: 0 .  doxycycline (VIBRAMYCIN) 100 MG capsule, Take 1 capsule (100 mg total) by mouth 2 (two) times daily., Disp: 20 capsule, Rfl: 0 .  ferrous sulfate 325 (65 FE) MG EC tablet, Take 325 mg by mouth 3 (three) times daily with meals.,  Disp: , Rfl:  .  fluticasone (FLONASE) 50 MCG/ACT nasal spray, Place into both nostrils daily., Disp: , Rfl:  .  losartan-hydrochlorothiazide (HYZAAR) 100-12.5 MG tablet, Take 1 tablet by mouth daily., Disp: , Rfl:  .  montelukast (SINGULAIR) 10 MG tablet, Take 10 mg by mouth at bedtime., Disp: , Rfl:  .  Multiple Vitamin (MULTIVITAMIN) capsule, Take 1 capsule by mouth daily., Disp:  , Rfl:  .  predniSONE (DELTASONE) 10 MG tablet, Start 60 mg po day one, then 50 mg po day two, taper by 10 mg daily until complete., Disp: 21 tablet, Rfl: 0 .  simvastatin (ZOCOR) 20 MG tablet, Take 20 mg by mouth daily., Disp: , Rfl:  .  tetrahydrozoline (VISINE) 0.05 % ophthalmic solution, , Disp: , Rfl:   Allergies Amlodipine; Atorvastatin; Penicillins; Rosuvastatin; and Sulfa antibiotics  Family History  Problem Relation Age of Onset  . Cancer Mother   . Cancer Father     Social History Social History  Substance Use Topics  . Smoking status: Never Smoker  . Smokeless tobacco: Never Used  . Alcohol use No    Review of Systems Constitutional: As above.  ENT: No sore throat. Cardiovascular: Denies chest pain. Respiratory: Denies shortness of breath. Gastrointestinal: No abdominal pain.  No nausea, no vomiting.  No diarrhea.   Genitourinary: Negative for dysuria. Musculoskeletal: Negative for back pain. Skin: Negative for rash.   ____________________________________________   PHYSICAL EXAM:  VITAL SIGNS: ED Triage Vitals  Enc Vitals Group     BP 04/02/17 1430 (!) 149/56     Pulse Rate 04/02/17 1430 67     Resp 04/02/17 1430 16     Temp 04/02/17 1430 98 F (36.7 C)     Temp Source 04/02/17 1430 Oral     SpO2 04/02/17 1430 99 %     Weight 04/02/17 1428 210 lb (95.3 kg)     Height 04/02/17 1428  (1.6 m)     Head Circumference --      Peak Flow --      Pain Score 04/02/17 1428 0     Pain Loc --      Pain Edu? --      Excl. in GC? --    Constitutional: Alert and oriented. Well appearing and in no acute distress. Eyes: Conjunctivae are normal. PERRL. EOMI. Head: Atraumatic.Mild to moderate tenderness to palpation bilateral maxillary sinuses, No frontal sinus tenderness to palpation.. No swelling. No erythema.   Ears: no erythema, normal TMs bilaterally.   Nose: nasal congestion with bilateral nasal turbinate erythema and edema.   Mouth/Throat: Mucous  membranes are moist.  Oropharynx non-erythematous.No tonsillar swelling or exudate.  Neck: No stridor.  No cervical spine tenderness to palpation. Hematological/Lymphatic/Immunilogical: No cervical lymphadenopathy. Cardiovascular: Normal rate, regular rhythm. Grossly normal heart sounds.  Good peripheral circulation. Respiratory: Normal respiratory effort.  No retractions. Mild scattered inspiratory wheezes. No rhonchi. No focal area of consolidation. Dry intermittent cough noted in room. Speaks in complete sentences. Good air movement.  Gastrointestinal: Soft and nontender.  Musculoskeletal: Ambulatory with steady gait. No cervical, thoracic or lumbar tenderness to palpation.  Neurologic:  Normal speech and language. No gait instability. Skin:  Skin is warm, dry and intact. No rash noted. Psychiatric: Mood and affect are normal. Speech and behavior are normal.  ___________________________________________   LABS (all labs ordered are listed, but only abnormal results are displayed)  Labs Reviewed - No data to display  RADIOLOGY  No results found. ____________________________________________  PROCEDURES Procedures     INITIAL IMPRESSION / ASSESSMENT AND PLAN / ED COURSE  Pertinent labs & imaging results that were available during my care of the patient were reviewed by me and considered in my medical decision making (see chart for details).  Well-appearing patient. No acute distress. Albuterol neb given once in urgent care, after neb, reevaluated and wheezes much improved, good air movement and patient reports feeling better. No rhonchi or focal area of consolidation auscultated. Suspect maxillary sinusitis and asthmatic bronchitis. Discussed with patient evaluation and treatment options including evaluation of chest x-ray, patient, chest x-ray at this time. Will treat patient with oral doxycycline, prednisone taper, continue home albuterol inhaler as needed and when necessary  Tessalon Perles in which patient reports she has at home as needed. Encouraged rest, fluids and supportive care. Discussed strict follow-up and return parameters.  Discussed follow up with Primary care physician this week. Discussed follow up and return parameters including no resolution or any worsening concerns. Patient verbalized understanding and agreed to plan.   ____________________________________________   FINAL CLINICAL IMPRESSION(S) / ED DIAGNOSES  Final diagnoses:  Asthmatic bronchitis with acute exacerbation, unspecified asthma severity, unspecified whether persistent  Acute maxillary sinusitis, recurrence not specified     Discharge Medication List as of 04/02/2017  3:28 PM    START taking these medications   Details  !! doxycycline (VIBRAMYCIN) 100 MG capsule Take 1 capsule (100 mg total) by mouth 2 (two) times daily., Starting Mon 04/02/2017, Normal    predniSONE (DELTASONE) 10 MG tablet Start 60 mg po day one, then 50 mg po day two, taper by 10 mg daily until complete., Normal     !! - Potential duplicate medications found. Please discuss with provider.      Note: This dictation was prepared with Dragon dictation along with smaller phrase technology. Any transcriptional errors that result from this process are unintentional.         Renford Dills, NP 04/02/17 2119

## 2017-04-02 NOTE — Discharge Instructions (Signed)
Take medication as prescribed. Rest. Drink plenty of fluids.  ° °Follow up with your primary care physician this week as needed. Return to Urgent care for new or worsening concerns.  ° °

## 2017-04-02 NOTE — ED Triage Notes (Signed)
Patient c/o ongoing cough and chest congestion for 3 weeks.

## 2017-04-12 ENCOUNTER — Ambulatory Visit
Admission: RE | Admit: 2017-04-12 | Discharge: 2017-04-12 | Disposition: A | Discharge status: Home / Self Care | Payer: Medicare HMO | Source: Ambulatory Visit | Attending: Family Medicine | Admitting: Family Medicine

## 2017-04-12 DIAGNOSIS — Z1231 Encounter for screening mammogram for malignant neoplasm of breast: Secondary | ICD-10-CM | POA: Insufficient documentation

## 2017-04-23 ENCOUNTER — Encounter (INDEPENDENT_AMBULATORY_CARE_PROVIDER_SITE_OTHER): Payer: Self-pay | Admitting: Vascular Surgery

## 2017-04-23 ENCOUNTER — Other Ambulatory Visit (INDEPENDENT_AMBULATORY_CARE_PROVIDER_SITE_OTHER): Payer: Self-pay | Admitting: Vascular Surgery

## 2017-04-23 ENCOUNTER — Ambulatory Visit (INDEPENDENT_AMBULATORY_CARE_PROVIDER_SITE_OTHER): Payer: Medicare HMO

## 2017-04-23 ENCOUNTER — Ambulatory Visit (INDEPENDENT_AMBULATORY_CARE_PROVIDER_SITE_OTHER): Payer: Medicare HMO | Admitting: Vascular Surgery

## 2017-04-23 DIAGNOSIS — I6529 Occlusion and stenosis of unspecified carotid artery: Secondary | ICD-10-CM | POA: Insufficient documentation

## 2017-04-23 DIAGNOSIS — J449 Chronic obstructive pulmonary disease, unspecified: Secondary | ICD-10-CM

## 2017-04-23 DIAGNOSIS — I6523 Occlusion and stenosis of bilateral carotid arteries: Secondary | ICD-10-CM | POA: Diagnosis not present

## 2017-04-23 DIAGNOSIS — E782 Mixed hyperlipidemia: Secondary | ICD-10-CM | POA: Diagnosis not present

## 2017-04-23 DIAGNOSIS — E785 Hyperlipidemia, unspecified: Secondary | ICD-10-CM | POA: Insufficient documentation

## 2017-04-23 DIAGNOSIS — I10 Essential (primary) hypertension: Secondary | ICD-10-CM

## 2017-04-23 LAB — VAS US CAROTID
LCCADDIAS: -15 cm/s
LCCADSYS: -129 cm/s
LICADDIAS: -16 cm/s
LICAPSYS: -203 cm/s
Left CCA prox dias: 15 cm/s
Left CCA prox sys: 131 cm/s
Left ICA dist sys: -95 cm/s
Left ICA prox dias: -40 cm/s
RCCADSYS: -118 cm/s
RCCAPSYS: 134 cm/s
RIGHT CCA MID DIAS: -15 cm/s
RIGHT ECA DIAS: -4 cm/s
Right CCA prox dias: 12 cm/s

## 2017-04-23 NOTE — Progress Notes (Signed)
MRN : [id number]  Becky Reese is a 71 y.o. (07/03/1946) female who presents with chief complaint of  Chief Complaint  Patient presents with  . Follow-up  .  History of Present Illness: The patient is seen for follow up evaluation of carotid stenosis. The carotid stenosis followed by ultrasound.   The patient denies amaurosis fugax. There is no recent history of TIA symptoms or focal motor deficits. There is no prior documented CVA.  The patient is taking enteric-coated aspirin 81 mg daily.  There is no history of migraine headaches. There is no history of seizures.  The patient has a history of coronary artery disease, no recent episodes of angina or shortness of breath. The patient denies PAD or claudication symptoms. There is a history of hyperlipidemia which is being treated with a statin.    Carotid Duplex done today shows 40-59% bilateral internal carotid artery stenosis.  No change compared to last study in 04/20/2016.  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  Current Meds  Medication Sig  . albuterol (PROVENTIL) (2.5 MG/3ML) 0.083% nebulizer solution Take 2.5 mg by nebulization every 6 (six) hours as needed for wheezing or shortness of breath.  Marland Kitchen aspirin 81 MG tablet Take 81 mg by mouth daily.  . beclomethasone (QVAR) 80 MCG/ACT inhaler Inhale into the lungs 2 (two) times daily.  . carboxymethylcellulose (REFRESH PLUS) 0.5 % SOLN 1 drop 3 (three) times daily as needed.  . cetirizine (ZYRTEC) 10 MG tablet Take 10 mg by mouth daily.  . cetirizine-pseudoephedrine (ZYRTEC-D) 5-120 MG tablet Take 1 tablet by mouth 2 (two) times daily.  Marland Kitchen desoximetasone (TOPICORT) 0.25 % cream Apply 1 application topically 2 (two) times daily.  . ferrous sulfate 325 (65 FE) MG EC tablet Take 325 mg by mouth 3 (three) times daily with meals.  . fluticasone (FLONASE) 50 MCG/ACT nasal spray Place into  both nostrils daily.  Marland Kitchen losartan-hydrochlorothiazide (HYZAAR) 100-12.5 MG tablet Take 1 tablet by mouth daily.  . montelukast (SINGULAIR) 10 MG tablet Take 10 mg by mouth at bedtime.  . Multiple Vitamin (MULTIVITAMIN) capsule Take 1 capsule by mouth daily.  . simvastatin (ZOCOR) 20 MG tablet Take 20 mg by mouth daily.  Marland Kitchen tetrahydrozoline (VISINE) 0.05 % ophthalmic solution     Past Medical History:  Diagnosis Date  . Asthma   . Diabetes mellitus without complication (HCC)   . Hemorrhoids   . Hyperlipidemia   . Hypertension   . Murmur, cardiac   . Osteopenia     Past Surgical History:  Procedure Laterality Date  . ABDOMINAL HYSTERECTOMY     PARTIAL  . BREAST CYST EXCISION Right    NEG  . BREAST SURGERY      Social History Social History  Substance Use Topics  . Smoking status: Never Smoker  . Smokeless tobacco: Never Used  . Alcohol use No    Family History Family History  Problem Relation Age of Onset  . Cancer Mother   . Cancer Father   . Breast cancer Neg Hx     Allergies  Allergen Reactions  . Amlodipine   . Atorvastatin     Muscle pain   . Penicillins     Hives   . Rosuvastatin     Muscle Pain   . Sulfa Antibiotics Hives     REVIEW OF SYSTEMS (Negative unless checked)  Constitutional: Weight loss  Fever  Chills Cardiac: Chest pain     Chest pressure   Palpitations   Shortness of breath when laying flat   Shortness of breath with exertion. Vascular:  Pain in legs with walking   Pain in legs at rest  History of DVT   Phlebitis   Swelling in legs   Varicose veins   Non-healing ulcers Pulmonary:   Uses home oxygen   Productive cough   Hemoptysis   Wheeze  COPD   Asthma Neurologic:  Dizziness   Seizures   History of stroke   History of TIA  Aphasia   Vissual changes   Weakness or numbness in arm   Weakness or numbness in leg Musculoskeletal:   Joint swelling   Joint pain   Low back  pain Hematologic:  Easy bruising  Easy bleeding   Hypercoagulable state   Anemic Gastrointestinal:  Diarrhea   Vomiting  Gastroesophageal reflux/heartburn   Difficulty swallowing. Genitourinary:  Chronic kidney disease   Difficult urination  Frequent urination   Blood in urine Skin:  Rashes   Ulcers  Psychological:  History of anxiety    History of major depression.  Physical Examination  Vitals:   04/23/17 1102 04/23/17 1104  BP: (!) 174/66 (!) 149/68  Pulse: 66   Resp: 16   Weight: 207 lb (93.9 kg)    Body mass index is 36.67 kg/m. Gen: WD/WN, NAD Head: [place]/AT, No temporalis wasting.  Ear/Nose/Throat: Hearing grossly intact, nares w/o erythema or drainage Eyes: PER, EOMI, sclera nonicteric.  Neck: Supple, no large masses.   Pulmonary:  Good air movement, no audible wheezing bilaterally, no use of accessory muscles.  Cardiac: RRR, no JVD Vascular: Bilateral carotid bruits Vessel Right Left  Radial Palpable Palpable  Ulnar Palpable Palpable  Brachial Palpable Palpable  Carotid Palpable Palpable  Gastrointestinal: Non-distended. No guarding/no peritoneal signs.  Musculoskeletal: M/S 5/5 throughout.  No deformity or atrophy.  Neurologic: CN 2-12 intact. Symmetrical.  Speech is fluent. Motor exam as listed above. Psychiatric: Judgment intact, Mood & affect appropriate for pt's clinical situation. Dermatologic: No rashes or ulcers noted.  No changes consistent with cellulitis. Lymph : No lichenification or skin changes of chronic lymphedema.  CBC No results found for: WBC, HGB, HCT, MCV, PLT  BMET No results found for: NA, K, CL, CO2, GLUCOSE, BUN, CREATININE, CALCIUM, GFRNONAA, GFRAA CrCl cannot be calculated (No order found.).  COAG No results found for: INR, PROTIME  Radiology Mm Digital Screening Bilateral  Result Date: 04/12/2017 CLINICAL DATA:  Screening. EXAM: DIGITAL SCREENING BILATERAL MAMMOGRAM WITH CAD COMPARISON:  Previous  exam(s). ACR Breast Density Category b: There are scattered areas of fibroglandular density. FINDINGS: There are no findings suspicious for malignancy. Images were processed with CAD. IMPRESSION: No mammographic evidence of malignancy. A result letter of this screening mammogram will be mailed directly to the patient. RECOMMENDATION: Screening mammogram in one year. (Code:SM-B-01Y) BI-RADS CATEGORY  1: Negative. Electronically Signed   By: Elberta Fortis M.D.   On: 04/12/2017 14:43    Assessment/Plan 1. Bilateral carotid artery stenosis Recommend:  Given the patient's asymptomatic subcritical stenosis no further invasive testing or surgery at this time.  Carotid Duplex done today shows 40-59% bilateral internal carotid artery stenosis.  No change compared to last study in 04/20/2016.  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  Continue antiplatelet therapy as prescribed Continue management of CAD, HTN and Hyperlipidemia Healthy heart diet,  encouraged exercise at least 4 times per week Follow up in 12  months with duplex ultrasound and physical exam based on  50% stenosis of the bilateral carotid artery   - VAS US CAROTID; Future  2. Chronic obstructive pulmonary disease, unspecified COPD type (HCC) Continue pulmonary medications and aerosols as already ordered, these medications have been reviewed and there are no changes at this time.   3. Essential hypertension Continue antihypertensive medications as already ordered, these medications have been reviewed and there are no changes at this time.   4. Mixed hyperlipidemia Continue statin as ordered and reviewed, no changes at this time    Levora Dredge, MD  04/23/2017 12:55 PM

## 2017-06-21 DIAGNOSIS — T466X5A Adverse effect of antihyperlipidemic and antiarteriosclerotic drugs, initial encounter: Secondary | ICD-10-CM | POA: Insufficient documentation

## 2017-08-12 DIAGNOSIS — I771 Stricture of artery: Secondary | ICD-10-CM | POA: Insufficient documentation

## 2017-08-12 DIAGNOSIS — L309 Dermatitis, unspecified: Secondary | ICD-10-CM | POA: Insufficient documentation

## 2017-08-12 DIAGNOSIS — I6523 Occlusion and stenosis of bilateral carotid arteries: Secondary | ICD-10-CM | POA: Insufficient documentation

## 2017-11-26 ENCOUNTER — Encounter: Payer: Self-pay | Admitting: *Deleted

## 2017-11-26 ENCOUNTER — Ambulatory Visit
Admission: EM | Admit: 2017-11-26 | Discharge: 2017-11-26 | Disposition: A | Discharge status: Home / Self Care | Payer: Medicare HMO | Attending: Family Medicine | Admitting: Family Medicine

## 2017-11-26 DIAGNOSIS — J0100 Acute maxillary sinusitis, unspecified: Secondary | ICD-10-CM | POA: Diagnosis not present

## 2017-11-26 MED ORDER — DOXYCYCLINE HYCLATE 100 MG PO TABS: 100.0000 mg | ORAL_TABLET | Freq: Two times a day (BID) | ORAL | 0 refills | Status: DC

## 2017-11-26 NOTE — ED Triage Notes (Signed)
Productive cough- clear, fever, runny nose and head congestion x 3 days.

## 2017-11-26 NOTE — ED Provider Notes (Signed)
[facility]    CSN: [id number] Arrival date & time: 11/26/17  0903     History   Chief Complaint Chief Complaint  Patient presents with  . Cough  . Nasal Congestion    HPI Becky Reese is a 71 y.o. female.   The history is provided by the patient.  URI  Presenting symptoms: congestion, cough and facial pain   Severity:  Moderate Onset quality:  Sudden Duration:  6 days Timing:  Constant Progression:  Worsening Chronicity:  New Relieved by:  Nothing Ineffective treatments:  OTC medications Associated symptoms: sinus pain   Associated symptoms: no wheezing   Risk factors: being elderly, chronic respiratory disease (asthma) and sick contacts   Risk factors: no immunosuppression and no recent travel     Past Medical History:  Diagnosis Date  . Asthma   . Diabetes mellitus without complication (HCC)   . Hemorrhoids   . Hyperlipidemia   . Hypertension   . Murmur, cardiac   . Osteopenia     Patient Active Problem List   Diagnosis Date Noted  . Carotid stenosis 04/23/2017  . COPD (chronic obstructive pulmonary disease) (HCC) 04/23/2017  . Essential hypertension 04/23/2017  . Hyperlipidemia 04/23/2017    Past Surgical History:  Procedure Laterality Date  . ABDOMINAL HYSTERECTOMY     PARTIAL  . BREAST CYST EXCISION Right    NEG  . BREAST SURGERY      OB History    No data available       Home Medications    Prior to Admission medications   Medication Sig Start Date End Date Taking? Authorizing Provider  aspirin 81 MG tablet Take 81 mg by mouth daily.   Yes [provider]  beclomethasone (QVAR) 80 MCG/ACT inhaler Inhale into the lungs 2 (two) times daily.   Yes [provider]  carboxymethylcellulose (REFRESH PLUS) 0.5 % SOLN 1 drop 3 (three) times daily as needed.   Yes [provider]  cetirizine (ZYRTEC) 10 MG tablet Take 10 mg by mouth daily.   Yes [provider]  cetirizine-pseudoephedrine  (ZYRTEC-D) 5-120 MG tablet Take 1 tablet by mouth 2 (two) times daily. 02/12/16  Yes Hassan Rowan, MD  ezetimibe (ZETIA) 10 MG tablet Take 10 mg by mouth daily.   Yes [provider]  ferrous sulfate 325 (65 FE) MG EC tablet Take 325 mg by mouth 3 (three) times daily with meals.   Yes [provider]  fluticasone (FLONASE) 50 MCG/ACT nasal spray Place into both nostrils daily.   Yes [provider]  Fluticasone-Salmeterol (ADVAIR) 100-50 MCG/DOSE AEPB Inhale 1 puff into the lungs 2 (two) times daily.   Yes [provider]  Multiple Vitamin (MULTIVITAMIN) capsule Take 1 capsule by mouth daily.   Yes [provider]  albuterol (PROVENTIL) (2.5 MG/3ML) 0.083% nebulizer solution Take 2.5 mg by nebulization every 6 (six) hours as needed for wheezing or shortness of breath.    [provider]  desoximetasone (TOPICORT) 0.25 % cream Apply 1 application topically 2 (two) times daily.    [provider]  doxycycline (VIBRA-TABS) 100 MG tablet Take 1 tablet (100 mg total) by mouth 2 (two) times daily. 11/26/17   Payton Mccallum, MD  losartan-hydrochlorothiazide (HYZAAR) 100-12.5 MG tablet Take 1 tablet by mouth daily.    [provider]  montelukast (SINGULAIR) 10 MG tablet Take 10 mg by mouth at bedtime.    [provider]  predniSONE (DELTASONE) 10 MG tablet Start  60 mg po day one, then 50 mg po day two, taper by 10 mg daily until complete. Patient not taking: Reported on 04/23/2017 04/02/17   Renford DillsMiller, Lindsey, NP  simvastatin (ZOCOR) 20 MG tablet Take 20 mg by mouth daily.    [provider]  tetrahydrozoline (VISINE) 0.05 % ophthalmic solution     [provider]    Family History Family History  Problem Relation Age of Onset  . Cancer Mother   . Cancer Father   . Breast cancer Neg Hx     Social History Social History   Tobacco Use  . Smoking status: Never Smoker  . Smokeless tobacco: Never Used    Substance Use Topics  . Alcohol use: No  . Drug use: No     Allergies   Amlodipine; Atorvastatin; Penicillins; Rosuvastatin; and Sulfa antibiotics   Review of Systems Review of Systems  HENT: Positive for congestion and sinus pain.   Respiratory: Positive for cough. Negative for wheezing.      Physical Exam Triage Vital Signs ED Triage Vitals  Enc Vitals Group     BP 11/26/17 0926 (!) 159/59     Pulse Rate 11/26/17 0926 85     Resp 11/26/17 0926 16     Temp 11/26/17 0926 99.1 F (37.3 C)     Temp Source 11/26/17 0926 Oral     SpO2 11/26/17 0926 96 %     Weight 11/26/17 0928 195 lb (88.5 kg)     Height 11/26/17 0928 5\' 3"  (1.6 m)     Head Circumference --      Peak Flow --      Pain Score --      Pain Loc --      Pain Edu? --      Excl. in GC? --    No data found.  Updated Vital Signs BP (!) 159/59 (BP Location: Left Arm)   Pulse 85   Temp 99.1 F (37.3 C) (Oral)   Resp 16   Ht 5\' 3"  (1.6 m)   Wt 195 lb (88.5 kg)   SpO2 96%   BMI 34.54 kg/m   Visual Acuity Right Eye Distance:   Left Eye Distance:   Bilateral Distance:    Right Eye Near:   Left Eye Near:    Bilateral Near:     Physical Exam  Constitutional: She appears well-developed and well-nourished. No distress.  HENT:  Head: Normocephalic and atraumatic.  Right Ear: Tympanic membrane, external ear and ear canal normal.  Left Ear: Tympanic membrane, external ear and ear canal normal.  Nose: Mucosal edema and rhinorrhea present. No nose lacerations, sinus tenderness, nasal deformity, septal deviation or nasal septal hematoma. No epistaxis.  No foreign bodies. Right sinus exhibits maxillary sinus tenderness and frontal sinus tenderness. Left sinus exhibits maxillary sinus tenderness and frontal sinus tenderness.  Mouth/Throat: Uvula is midline, oropharynx is clear and moist and mucous membranes are normal. No oropharyngeal exudate.  Eyes: Conjunctivae and EOM are normal. Pupils are equal, round,  and reactive to light. Right eye exhibits no discharge. Left eye exhibits no discharge. No scleral icterus.  Neck: Normal range of motion. Neck supple. No thyromegaly present.  Cardiovascular: Normal rate, regular rhythm and normal heart sounds.  Pulmonary/Chest: Effort normal and breath sounds normal. No respiratory distress. She has no wheezes. She has no rales.  Lymphadenopathy:    She has no cervical adenopathy.  Skin: She is not diaphoretic.  Nursing note and vitals reviewed.  UC Treatments / Results  Labs (all labs ordered are listed, but only abnormal results are displayed) Labs Reviewed - No data to display  EKG  EKG Interpretation None       Radiology No results found.  Procedures Procedures (including critical care time)  Medications Ordered in UC Medications - No data to display   Initial Impression / Assessment and Plan / UC Course  I have reviewed the triage vital signs and the nursing notes.  Pertinent labs & imaging results that were available during my care of the patient were reviewed by me and considered in my medical decision making (see chart for details).       Final Clinical Impressions(s) / UC Diagnoses   Final diagnoses:  Acute maxillary sinusitis, recurrence not specified    ED Discharge Orders        Ordered    doxycycline (VIBRA-TABS) 100 MG tablet  2 times daily     11/26/17 1011     1.diagnosis reviewed with patient 2. rx as per orders above; reviewed possible side effects, interactions, risks and benefits  3. Recommend supportive treatment with otc meds 4. Follow-up prn if symptoms worsen or don't improve  Controlled Substance Prescriptions [place] Controlled Substance Registry consulted? Not Applicable   Payton Mccallumonty, Nicolette Gieske, MD 11/26/17 1021

## 2017-12-22 ENCOUNTER — Other Ambulatory Visit: Payer: Self-pay

## 2017-12-22 ENCOUNTER — Ambulatory Visit
Admission: EM | Admit: 2017-12-22 | Discharge: 2017-12-22 | Disposition: A | Discharge status: Home / Self Care | Payer: Medicare HMO | Attending: Family Medicine | Admitting: Family Medicine

## 2017-12-22 ENCOUNTER — Encounter: Payer: Self-pay | Admitting: Gynecology

## 2017-12-22 DIAGNOSIS — I10 Essential (primary) hypertension: Secondary | ICD-10-CM

## 2017-12-22 DIAGNOSIS — R030 Elevated blood-pressure reading, without diagnosis of hypertension: Secondary | ICD-10-CM

## 2017-12-22 DIAGNOSIS — J0100 Acute maxillary sinusitis, unspecified: Secondary | ICD-10-CM | POA: Diagnosis not present

## 2017-12-22 MED ORDER — LEVOFLOXACIN 500 MG PO TABS: 500.0000 mg | ORAL_TABLET | Freq: Every day | ORAL | 0 refills | Status: DC

## 2017-12-22 NOTE — ED Triage Notes (Signed)
Patient c/o sinus problem x last night.

## 2017-12-22 NOTE — ED Provider Notes (Signed)
[facility] ____________________________________________  Time seen: Approximately 1242 PM  I have reviewed the triage vital signs and the nursing notes.   HISTORY  Chief Complaint Sinusitis   HPI Becky Reese is a 71 y.o. female presented for evaluation of nasal congestion, nasal drainage and sinus pressure.  Patient reports that she was seen in urgent care about 2 weeks ago for the same complaint was put on oral doxycycline.  States that symptoms did improve, but feels like symptoms never fully resolved as she had continued sinus pressure and congestion.  States over the last 2 days her sinus congestion and pressure sensation has further increased.  States having more postnasal drainage.  Denies known fevers.  States not much of a cough.  Denies fevers, chest pain or shortness of breath, hemoptysis, abdominal pain or other recent changes.  Reports continues to eat and drink well.  Unresolved with her home daily antihistamine and Flonase nasal spray.  Reports recently around family with some similar complaints. Denies recent sickness.  Patient follows with nephrology due to chronic renal disease, patient states last GFR 50.  Rayetta Humphrey, MD: PCP   Past Medical History:  Diagnosis Date  . Asthma   . Diabetes mellitus without complication (HCC)   . Hemorrhoids   . Hyperlipidemia   . Hypertension   . Murmur, cardiac   . Osteopenia     Patient Active Problem List   Diagnosis Date Noted  . Carotid stenosis 04/23/2017  . COPD (chronic obstructive pulmonary disease) (HCC) 04/23/2017  . Essential hypertension 04/23/2017  . Hyperlipidemia 04/23/2017    Past Surgical History:  Procedure Laterality Date  . ABDOMINAL HYSTERECTOMY     PARTIAL  . BREAST CYST EXCISION Right    NEG  . BREAST SURGERY       No current facility-administered medications for this encounter.   Current Outpatient Medications:  .  albuterol (PROVENTIL) (2.5 MG/3ML) 0.083%  nebulizer solution, Take 2.5 mg by nebulization every 6 (six) hours as needed for wheezing or shortness of breath., Disp: , Rfl:  .  aspirin 81 MG tablet, Take 81 mg by mouth daily., Disp: , Rfl:  .  beclomethasone (QVAR) 80 MCG/ACT inhaler, Inhale into the lungs 2 (two) times daily., Disp: , Rfl:  .  carboxymethylcellulose (REFRESH PLUS) 0.5 % SOLN, 1 drop 3 (three) times daily as needed., Disp: , Rfl:  .  carvedilol (COREG) 6.25 MG tablet, Take 6.25 mg by mouth 2 (two) times daily with a meal., Disp: , Rfl:  .  cetirizine (ZYRTEC) 10 MG tablet, Take 10 mg by mouth daily., Disp: , Rfl:  .  cetirizine-pseudoephedrine (ZYRTEC-D) 5-120 MG tablet, Take 1 tablet by mouth 2 (two) times daily., Disp: 60 tablet, Rfl: 0 .  desoximetasone (TOPICORT) 0.25 % cream, Apply 1 application topically 2 (two) times daily., Disp: , Rfl:  .  ezetimibe (ZETIA) 10 MG tablet, Take 10 mg by mouth daily., Disp: , Rfl:  .  ferrous sulfate 325 (65 FE) MG EC tablet, Take 325 mg by mouth 3 (three) times daily with meals., Disp: , Rfl:  .  fluticasone (FLONASE) 50 MCG/ACT nasal spray, Place into both nostrils daily., Disp: , Rfl:  .  Fluticasone-Salmeterol (ADVAIR) 100-50 MCG/DOSE AEPB, Inhale 1 puff into the lungs 2 (two) times daily., Disp: , Rfl:  .  losartan-hydrochlorothiazide (HYZAAR) 100-12.5 MG tablet, Take 1 tablet by mouth daily., Disp: , Rfl:  .  montelukast (SINGULAIR) 10 MG tablet, Take 10 mg by mouth at  bedtime., Disp: , Rfl:  .  Multiple Vitamin (MULTIVITAMIN) capsule, Take 1 capsule by mouth daily., Disp: , Rfl:  .  simvastatin (ZOCOR) 20 MG tablet, Take 20 mg by mouth daily., Disp: , Rfl:  .  levofloxacin (LEVAQUIN) 500 MG tablet, Take 1 tablet (500 mg total) by mouth daily., Disp: 5 tablet, Rfl: 0 .  tetrahydrozoline (VISINE) 0.05 % ophthalmic solution, , Disp: , Rfl:   Allergies Amlodipine; Atorvastatin; Penicillins; Rosuvastatin; and Sulfa antibiotics  Family History  Problem Relation Age of Onset  .  Cancer Mother   . Cancer Father   . Breast cancer Neg Hx     Social History Social History   Tobacco Use  . Smoking status: Never Smoker  . Smokeless tobacco: Never Used  Substance Use Topics  . Alcohol use: No  . Drug use: No    Review of Systems Constitutional: No fever/chills ENT: No sore throat. AS above.  Cardiovascular: Denies chest pain. Respiratory: Denies shortness of breath. Gastrointestinal: No abdominal pain.  Musculoskeletal: Negative for back pain. Skin: Negative for rash.  ____________________________________________   PHYSICAL EXAM:  VITAL SIGNS: ED Triage Vitals  Enc Vitals Group     BP 12/22/17 1127 (!) 187/62     Pulse Rate 12/22/17 1127 64     Resp 12/22/17 1127 16     Temp 12/22/17 1127 98.4 F (36.9 C)     Temp Source 12/22/17 1127 Oral     SpO2 12/22/17 1127 100 %     Weight 12/22/17 1127 195 lb (88.5 kg)     Height 12/22/17 1127 5\' 3"  (1.6 m)     Head Circumference --      Peak Flow --      Pain Score 12/22/17 1131 3     Pain Loc --      Pain Edu? --      Excl. in GC? --     Constitutional: Alert and oriented. Well appearing and in no acute distress. Eyes: Conjunctivae are normal.  Head: Atraumatic.Mild to moderate tenderness to palpation bilateral frontal and maxillary sinuses. No swelling. No erythema.   Ears: no erythema, normal TMs bilaterally.   Nose: nasal congestion with bilateral nasal turbinate erythema and edema.   Mouth/Throat: Mucous membranes are moist.  Oropharynx non-erythematous.No tonsillar swelling or exudate.  Postnasal drainage present. Neck: No stridor.  No cervical spine tenderness to palpation. Hematological/Lymphatic/Immunilogical: No cervical lymphadenopathy. Cardiovascular: Normal rate, regular rhythm. Grossly normal heart sounds.  Good peripheral circulation. Respiratory: Normal respiratory effort.  No retractions.No wheezes, rales or rhonchi. Good air movement.  Musculoskeletal: No cervical, thoracic or  lumbar tenderness to palpation.  Neurologic:  Normal speech and language. No gross focal neurologic deficits are appreciated. No gait instability.  No paresthesias. Skin:  Skin is warm, dry and intact. No rash noted. Psychiatric: Mood and affect are normal. Speech and behavior are normal.  ___________________________________________   LABS (all labs ordered are listed, but only abnormal results are displayed)  Labs Reviewed - No data to display ____________________________________________  PROCEDURES Procedures   INITIAL IMPRESSION / ASSESSMENT AND PLAN / ED COURSE  Pertinent labs & imaging results that were available during my care of the patient were reviewed by me and considered in my medical decision making (see chart for details).  Well-appearing patient.  No acute distress.  Concern for unresolved continued sinusitis complaints.  Patient chronic renal insufficiency, highly allergic to penicillin, recently treated with oral doxycycline.  Last creatinine via care everywhere 1.5.  Will  treat patient with oral Levaquin.  Continue home antihistamine and daily Flonase.  Encourage rest, fluids, supportive care.  Patient also hypertensive at urgent care visit, patient states that she forgot to take her carvedilol this morning, and will take her medications as prescribed tonight and continue from there.  Patient states that she has been monitoring her blood pressure at home and usually 130/70.  Counseled regarding close monitoring.Discussed indication, risks and benefits of medications with patient.  Discussed follow up with Primary care physician this week. Discussed follow up and return parameters including no resolution or any worsening concerns. Patient verbalized understanding and agreed to plan.   ____________________________________________   FINAL CLINICAL IMPRESSION(S) / ED DIAGNOSES  Final diagnoses:  Acute maxillary sinusitis, recurrence not specified  Elevated blood pressure  reading     ED Discharge Orders        Ordered    levofloxacin (LEVAQUIN) 500 MG tablet  Daily     12/22/17 1259       Note: This dictation was prepared with Dragon dictation along with smaller phrase technology. Any transcriptional errors that result from this process are unintentional.         Renford DillsMiller, Azzure Garabedian, NP 12/22/17 1351

## 2017-12-22 NOTE — Discharge Instructions (Signed)
Take medication as prescribed. Rest. Drink plenty of fluids.  ° °Follow up with your primary care physician this week as needed. Return to Urgent care for new or worsening concerns.  ° °

## 2018-03-19 ENCOUNTER — Other Ambulatory Visit: Payer: Self-pay | Admitting: Family Medicine

## 2018-03-19 DIAGNOSIS — Z1231 Encounter for screening mammogram for malignant neoplasm of breast: Secondary | ICD-10-CM

## 2018-04-15 ENCOUNTER — Ambulatory Visit
Admission: RE | Admit: 2018-04-15 | Discharge: 2018-04-15 | Disposition: A | Discharge status: Home / Self Care | Payer: Medicare HMO | Source: Ambulatory Visit | Attending: Family Medicine | Admitting: Family Medicine

## 2018-04-15 DIAGNOSIS — Z1231 Encounter for screening mammogram for malignant neoplasm of breast: Secondary | ICD-10-CM | POA: Insufficient documentation

## 2018-04-22 ENCOUNTER — Encounter (INDEPENDENT_AMBULATORY_CARE_PROVIDER_SITE_OTHER): Payer: Self-pay | Admitting: Vascular Surgery

## 2018-04-22 ENCOUNTER — Ambulatory Visit (INDEPENDENT_AMBULATORY_CARE_PROVIDER_SITE_OTHER): Payer: Medicare HMO | Admitting: Vascular Surgery

## 2018-04-22 ENCOUNTER — Ambulatory Visit (INDEPENDENT_AMBULATORY_CARE_PROVIDER_SITE_OTHER): Payer: Medicare HMO

## 2018-04-22 VITALS — BP 190/71 | HR 73 | Resp 17 | Ht 63.0 in | Wt 197.0 lb

## 2018-04-22 DIAGNOSIS — E782 Mixed hyperlipidemia: Secondary | ICD-10-CM

## 2018-04-22 DIAGNOSIS — I6523 Occlusion and stenosis of bilateral carotid arteries: Secondary | ICD-10-CM

## 2018-04-22 DIAGNOSIS — J449 Chronic obstructive pulmonary disease, unspecified: Secondary | ICD-10-CM

## 2018-04-22 DIAGNOSIS — I10 Essential (primary) hypertension: Secondary | ICD-10-CM | POA: Diagnosis not present

## 2018-04-22 NOTE — Progress Notes (Signed)
MRN : [id number]  Becky Reese is a 72 y.o. (Oct 02, 1946) female who presents with chief complaint of No chief complaint on file. Marland Kitchen  History of Present Illness:   The patient is seen for follow up evaluation of carotid stenosis. The carotid stenosis followed by ultrasound.   The patient denies amaurosis fugax. There is no recent history of TIA symptoms or focal motor deficits. There is no prior documented CVA.  The patient is taking enteric-coated aspirin 81 mg daily.  There is no history of migraine headaches. There is no history of seizures.  The patient has a history of coronary artery disease, no recent episodes of angina or shortness of breath. The patient denies PAD or claudication symptoms. There is a history of hyperlipidemia which is being treated with a statin.   Carotid Duplex done today shows 40-59% bilateral internal carotid artery stenosis.  No change compared to last study in 04/23/2017.  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     No outpatient medications have been marked as taking for the 04/22/18 encounter (Appointment) with Gilda Crease, Latina Craver, MD.    Past Medical History:  Diagnosis Date  . Asthma   . Diabetes mellitus without complication (HCC)   . Hemorrhoids   . Hyperlipidemia   . Hypertension   . Murmur, cardiac   . Osteopenia     Past Surgical History:  Procedure Laterality Date  . ABDOMINAL HYSTERECTOMY     PARTIAL  . BREAST CYST EXCISION Right    NEG  . BREAST SURGERY      Social History Social History   Tobacco Use  . Smoking status: Never Smoker  . Smokeless tobacco: Never Used  Substance Use Topics  . Alcohol use: No  . Drug use: No    Family History Family History  Problem Relation Age of Onset  . Cancer Mother   . Cancer Father   . Breast cancer Neg Hx     Allergies  Allergen Reactions  . Amlodipine   .  Atorvastatin     Muscle pain   . Penicillins     Hives   . Rosuvastatin     Muscle Pain   . Sulfa Antibiotics Hives     REVIEW OF SYSTEMS (Negative unless checked)  Constitutional: Weight loss  Fever  Chills Cardiac: Chest pain   Chest pressure   Palpitations   Shortness of breath when laying flat   Shortness of breath with exertion. Vascular:  Pain in legs with walking   Pain in legs at rest  History of DVT   Phlebitis   Swelling in legs   Varicose veins   Non-healing ulcers Pulmonary:   Uses home oxygen   Productive cough   Hemoptysis   Wheeze  COPD   Asthma Neurologic:  Dizziness   Seizures   History of stroke   History of TIA  Aphasia   Vissual changes   Weakness or numbness in arm   Weakness or numbness in leg Musculoskeletal:   Joint swelling   Joint pain   Low back pain Hematologic:  Easy bruising  Easy bleeding   Hypercoagulable state   Anemic Gastrointestinal:  Diarrhea   Vomiting  Gastroesophageal reflux/heartburn   Difficulty swallowing. Genitourinary:  Chronic kidney disease   Difficult urination  Frequent urination   Blood in urine Skin:  Rashes   Ulcers  Psychological:  History of anxiety    History of  major depression.  Physical Examination  There were no vitals filed for this visit. There is no height or weight on file to calculate BMI. Gen: WD/WN, NAD Head: /AT, No temporalis wasting.  Ear/Nose/Throat: Hearing grossly intact, nares w/o erythema or drainage Eyes: PER, EOMI, sclera nonicteric.  Neck: Supple, no large masses.   Pulmonary:  Good air movement, no audible wheezing bilaterally, no use of accessory muscles.  Cardiac: RRR, no JVD Vascular: bilateral carotid bruits. Trace edema of both ankles Vessel Right Left  Radial Palpable Palpable  Ulnar Palpable Palpable  Brachial Palpable Palpable  Carotid Palpable Palpable  Gastrointestinal: Non-distended. No  guarding/no peritoneal signs.  Musculoskeletal: M/S 5/5 throughout.  No deformity or atrophy.  Neurologic: CN 2-12 intact. Symmetrical.  Speech is fluent. Motor exam as listed above. Psychiatric: Judgment intact, Mood & affect appropriate for pt's clinical situation. Dermatologic: No rashes or ulcers noted.  No changes consistent with cellulitis. Lymph : No lichenification or skin changes of chronic lymphedema.  CBC No results found for: WBC, HGB, HCT, MCV, PLT  BMET No results found for: NA, K, CL, CO2, GLUCOSE, BUN, CREATININE, CALCIUM, GFRNONAA, GFRAA CrCl cannot be calculated (No order found.).  COAG No results found for: INR, PROTIME  Radiology Mm Screening Breast Tomo Bilateral  Result Date: 04/15/2018 CLINICAL DATA:  Screening. EXAM: DIGITAL SCREENING BILATERAL MAMMOGRAM WITH TOMO AND CAD COMPARISON:  Previous exam(s). ACR Breast Density Category b: There are scattered areas of fibroglandular density. FINDINGS: There are no findings suspicious for malignancy. Images were processed with CAD. IMPRESSION: No mammographic evidence of malignancy. A result letter of this screening mammogram will be mailed directly to the patient. RECOMMENDATION: Screening mammogram in one year. (Code:SM-B-01Y) BI-RADS CATEGORY  1: Negative. Electronically Signed   By: Amie Portland M.D.   On: 04/15/2018 12:48     Assessment/Plan 1. Bilateral carotid artery stenosis Recommend:  Given the patient's asymptomatic subcritical stenosis no further invasive testing or surgery at this time.  Carotid Duplex done today shows 40-59% bilateral internal carotid artery stenosis.  No change compared to last study in 04/20/2016.  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  Continue antiplatelet therapy as prescribed Continue management of CAD, HTN and Hyperlipidemia Healthy heart diet,  encouraged exercise at least 4 times per  week Follow up in 12 months with duplex ultrasound and physical exam based on  50% stenosis of the bilateral carotid artery   - VAS US CAROTID; Future   2. Essential hypertension Continue antihypertensive medications as already ordered, these medications have been reviewed and there are no changes at this time.   3. Chronic obstructive pulmonary disease, unspecified COPD type (HCC) Continue pulmonary medications and aerosols as already ordered, these medications have been reviewed and there are no changes at this time.   4. Mixed hyperlipidemia Continue statin as ordered and reviewed, no changes at this time     Levora Dredge, MD  04/22/2018 1:31 PM

## 2018-04-28 ENCOUNTER — Encounter (INDEPENDENT_AMBULATORY_CARE_PROVIDER_SITE_OTHER): Payer: Self-pay | Admitting: Vascular Surgery

## 2019-01-28 ENCOUNTER — Encounter: Payer: Self-pay | Admitting: Emergency Medicine

## 2019-01-29 ENCOUNTER — Ambulatory Visit: Payer: Medicare HMO | Admitting: Anesthesiology

## 2019-01-29 ENCOUNTER — Encounter
Admission: RE | Disposition: A | Discharge status: Home / Self Care | Payer: Self-pay | Source: Home / Self Care | Attending: Unknown Physician Specialty

## 2019-01-29 ENCOUNTER — Ambulatory Visit
Admission: RE | Admit: 2019-01-29 | Discharge: 2019-01-29 | Disposition: A | Discharge status: Home / Self Care | Payer: Medicare HMO | Attending: Unknown Physician Specialty | Admitting: Unknown Physician Specialty

## 2019-01-29 DIAGNOSIS — J449 Chronic obstructive pulmonary disease, unspecified: Secondary | ICD-10-CM | POA: Insufficient documentation

## 2019-01-29 DIAGNOSIS — K219 Gastro-esophageal reflux disease without esophagitis: Secondary | ICD-10-CM | POA: Diagnosis not present

## 2019-01-29 DIAGNOSIS — N189 Chronic kidney disease, unspecified: Secondary | ICD-10-CM | POA: Diagnosis not present

## 2019-01-29 DIAGNOSIS — Z7982 Long term (current) use of aspirin: Secondary | ICD-10-CM | POA: Diagnosis not present

## 2019-01-29 DIAGNOSIS — Z888 Allergy status to other drugs, medicaments and biological substances status: Secondary | ICD-10-CM | POA: Insufficient documentation

## 2019-01-29 DIAGNOSIS — Z86010 Personal history of colonic polyps: Secondary | ICD-10-CM | POA: Insufficient documentation

## 2019-01-29 DIAGNOSIS — R1013 Epigastric pain: Secondary | ICD-10-CM | POA: Insufficient documentation

## 2019-01-29 DIAGNOSIS — J45909 Unspecified asthma, uncomplicated: Secondary | ICD-10-CM | POA: Diagnosis not present

## 2019-01-29 DIAGNOSIS — K640 First degree hemorrhoids: Secondary | ICD-10-CM | POA: Insufficient documentation

## 2019-01-29 DIAGNOSIS — D509 Iron deficiency anemia, unspecified: Secondary | ICD-10-CM | POA: Diagnosis present

## 2019-01-29 DIAGNOSIS — K319 Disease of stomach and duodenum, unspecified: Secondary | ICD-10-CM | POA: Insufficient documentation

## 2019-01-29 DIAGNOSIS — I129 Hypertensive chronic kidney disease with stage 1 through stage 4 chronic kidney disease, or unspecified chronic kidney disease: Secondary | ICD-10-CM | POA: Diagnosis not present

## 2019-01-29 DIAGNOSIS — Z882 Allergy status to sulfonamides status: Secondary | ICD-10-CM | POA: Insufficient documentation

## 2019-01-29 DIAGNOSIS — E785 Hyperlipidemia, unspecified: Secondary | ICD-10-CM | POA: Insufficient documentation

## 2019-01-29 DIAGNOSIS — Z1211 Encounter for screening for malignant neoplasm of colon: Secondary | ICD-10-CM | POA: Diagnosis not present

## 2019-01-29 DIAGNOSIS — Z79899 Other long term (current) drug therapy: Secondary | ICD-10-CM | POA: Insufficient documentation

## 2019-01-29 DIAGNOSIS — E1122 Type 2 diabetes mellitus with diabetic chronic kidney disease: Secondary | ICD-10-CM | POA: Diagnosis not present

## 2019-01-29 DIAGNOSIS — M8580 Other specified disorders of bone density and structure, unspecified site: Secondary | ICD-10-CM | POA: Insufficient documentation

## 2019-01-29 DIAGNOSIS — Z7951 Long term (current) use of inhaled steroids: Secondary | ICD-10-CM | POA: Insufficient documentation

## 2019-01-29 DIAGNOSIS — Z880 Allergy status to penicillin: Secondary | ICD-10-CM | POA: Diagnosis not present

## 2019-01-29 HISTORY — PX: COLONOSCOPY WITH PROPOFOL: SHX5780

## 2019-01-29 HISTORY — PX: ESOPHAGOGASTRODUODENOSCOPY (EGD) WITH PROPOFOL: SHX5813

## 2019-01-29 HISTORY — DX: Chronic kidney disease, unspecified: N18.9

## 2019-01-29 SURGERY — COLONOSCOPY WITH PROPOFOL
Anesthesia: General

## 2019-01-29 MED ORDER — PROPOFOL 500 MG/50ML IV EMUL
INTRAVENOUS | Status: AC
  Filled 2019-01-29: qty 50

## 2019-01-29 MED ORDER — FENTANYL CITRATE (PF) 100 MCG/2ML IJ SOLN
INTRAMUSCULAR | Status: AC
  Filled 2019-01-29: qty 2

## 2019-01-29 MED ORDER — PROPOFOL 500 MG/50ML IV EMUL
INTRAVENOUS | Status: DC | PRN
  Administered 2019-01-29: 180 ug/kg/min via INTRAVENOUS

## 2019-01-29 MED ORDER — PHENYLEPHRINE HCL 10 MG/ML IJ SOLN
INTRAMUSCULAR | Status: DC | PRN
  Administered 2019-01-29: 100 ug via INTRAVENOUS

## 2019-01-29 MED ORDER — PROPOFOL 10 MG/ML IV BOLUS
INTRAVENOUS | Status: DC | PRN
  Administered 2019-01-29: 100 mg via INTRAVENOUS

## 2019-01-29 MED ORDER — SODIUM CHLORIDE 0.9 % IV SOLN
INTRAVENOUS | Status: DC
  Administered 2019-01-29: 11:00:00 via INTRAVENOUS

## 2019-01-29 MED ORDER — LIDOCAINE 2% (20 MG/ML) 5 ML SYRINGE
INTRAMUSCULAR | Status: DC | PRN
  Administered 2019-01-29: 30 mg via INTRAVENOUS

## 2019-01-29 MED ORDER — FENTANYL CITRATE (PF) 100 MCG/2ML IJ SOLN
INTRAMUSCULAR | Status: DC | PRN
  Administered 2019-01-29 (×2): 50 ug via INTRAVENOUS

## 2019-01-29 MED ORDER — SODIUM CHLORIDE 0.9 % IV SOLN: INTRAVENOUS | Status: DC

## 2019-01-29 NOTE — Op Note (Signed)
[facility] Gastroenterology Patient Name: Becky Reese Procedure Date: 01/29/2019 11:28 AM MRN: [id number] Account #: [id number] Date of Birth: [birth year] Admit Type: Outpatient Age: 73 Room: [facility] ENDO ROOM 3 Gender: Female Note Status: Finalized Procedure:            Upper GI endoscopy Indications:          Epigastric abdominal pain, Dyspepsia, Suspected                        gastro-esophageal reflux disease Providers:            Scot Jun, MD Medicines:            Propofol per Anesthesia Complications:        No immediate complications. Procedure:            Pre-Anesthesia Assessment:                       - After reviewing the risks and benefits, the patient                        was deemed in satisfactory condition to undergo the                        procedure.                       After obtaining informed consent, the endoscope was                        passed under direct vision. Throughout the procedure,                        the patient's blood pressure, pulse, and oxygen                        saturations were monitored continuously. The Endoscope                        was introduced through the mouth, and advanced to the                        second part of duodenum. The upper GI endoscopy was                        accomplished without difficulty. The patient tolerated                        the procedure well. Findings:      LA Grade A (one or more mucosal breaks less than 5 mm, not extending       between tops of 2 mucosal folds) esophagitis with no bleeding was found       39 cm from the incisors.      Localized moderate inflammation characterized by erosions and linear       erosions was found in the gastric antrum. Biopsies were taken with a       cold forceps for histology. Biopsies were taken with a cold forceps for       Helicobacter pylori testing.      The examined duodenum was normal. Impression:           -  LA Grade A  reflux esophagitis. Rule out Barrett's                        esophagus.                       - Gastritis. Biopsied.                       - Normal examined duodenum. Recommendation:       - Await pathology results. Scot Junobert T Elliott, MD 01/29/2019 11:51:48 AM This report has been signed electronically. Number of Addenda: 0 Note Initiated On: 01/29/2019 11:28 AM      [facility]

## 2019-01-29 NOTE — Anesthesia Preprocedure Evaluation (Addendum)
Anesthesia Evaluation  Patient identified by MRN, date of birth, ID band Patient awake    Reviewed: Allergy & Precautions, H&P , NPO status , Patient's Chart, lab work & pertinent test results  Airway Mallampati: III       Dental   Pulmonary asthma , COPD,  COPD inhaler, neg recent URI,           Cardiovascular hypertension, Pt. on medications and On Home Beta Blockers + Valvular Problems/Murmurs (murmur)   Echo 2017: NORMAL LEFT VENTRICULAR SYSTOLIC FUNCTION WITH MILD LVH NORMAL RIGHT VENTRICULAR SYSTOLIC FUNCTION MILD VALVULAR REGURGITATION NO VALVULAR STENOSIS EF 55%   Neuro/Psych negative neurological ROS  negative psych ROS   GI/Hepatic negative GI ROS, Neg liver ROS,   Endo/Other  diabetes  Renal/GU CRFRenal disease  negative genitourinary   Musculoskeletal   Abdominal   Peds  Hematology negative hematology ROS (+)   Anesthesia Other Findings Past Medical History: No date: Asthma No date: Chronic kidney disease No date: Diabetes mellitus without complication (HCC) No date: Hemorrhoids No date: Hyperlipidemia No date: Hypertension No date: Murmur, cardiac No date: Osteopenia  Past Surgical History: No date: ABDOMINAL HYSTERECTOMY     Comment:  PARTIAL No date: BREAST CYST EXCISION; Right     Comment:  NEG No date: BREAST SURGERY No date: COLONOSCOPY  BMI    Body Mass Index:  34.90 kg/m      Reproductive/Obstetrics negative OB ROS                           Anesthesia Physical Anesthesia Plan  ASA: III  Anesthesia Plan: General   Post-op Pain Management:    Induction:   PONV Risk Score and Plan: Propofol infusion and TIVA  Airway Management Planned: Natural Airway  Additional Equipment:   Intra-op Plan:   Post-operative Plan:   Informed Consent: I have reviewed the patients History and Physical, chart, labs and discussed the procedure including the  risks, benefits and alternatives for the proposed anesthesia with the patient or authorized representative who has indicated his/her understanding and acceptance.     Dental Advisory Given  Plan Discussed with: Anesthesiologist and CRNA  Anesthesia Plan Comments:         Anesthesia Quick Evaluation

## 2019-01-29 NOTE — Anesthesia Post-op Follow-up Note (Signed)
Anesthesia QCDR form completed.        

## 2019-01-29 NOTE — H&P (Signed)
Primary Care Physician:  Rayetta HumphreyGeorge, Sionne A, MD Primary Gastroenterologist:  Dr. Mechele CollinElliott  Pre-Procedure History & Physical: HPI:  Becky Reese is a 73 y.o. female is here for an endoscopy and colonoscopy.   Past Medical History:  Diagnosis Date  . Asthma   . Chronic kidney disease   . Diabetes mellitus without complication (HCC)   . Hemorrhoids   . Hyperlipidemia   . Hypertension   . Murmur, cardiac   . Osteopenia     Past Surgical History:  Procedure Laterality Date  . ABDOMINAL HYSTERECTOMY     PARTIAL  . BREAST CYST EXCISION Right    NEG  . BREAST SURGERY    . COLONOSCOPY      Prior to Admission medications   Medication Sig Start Date End Date Taking? Authorizing Provider  albuterol (PROVENTIL) (2.5 MG/3ML) 0.083% nebulizer solution Take 2.5 mg by nebulization every 6 (six) hours as needed for wheezing or shortness of breath.   Yes [provider]  aspirin 81 MG tablet Take 81 mg by mouth daily.   Yes [provider]  beclomethasone (QVAR) 80 MCG/ACT inhaler Inhale into the lungs 2 (two) times daily.   Yes [provider]  carboxymethylcellulose (REFRESH PLUS) 0.5 % SOLN 1 drop 3 (three) times daily as needed.   Yes [provider]  carvedilol (COREG) 6.25 MG tablet Take 6.25 mg by mouth 2 (two) times daily with a meal.   Yes [provider]  Coenzyme Q10 (UBIDECARENONE) POWD by Does not apply route.   Yes [provider]  ezetimibe (ZETIA) 10 MG tablet Take 10 mg by mouth daily.   Yes [provider]  fluticasone (FLONASE) 50 MCG/ACT nasal spray Place into both nostrils daily.   Yes [provider]  Fluticasone-Salmeterol (ADVAIR) 100-50 MCG/DOSE AEPB Inhale 1 puff into the lungs 2 (two) times daily.   Yes [provider]  Fluticasone-Salmeterol (ADVAIR) 250-50 MCG/DOSE AEPB Inhale 1 puff into the lungs 2 (two) times daily.   Yes [provider]  losartan (COZAAR) 100 MG  tablet Take 100 mg by mouth daily.   Yes [provider]  losartan-hydrochlorothiazide (HYZAAR) 100-12.5 MG tablet Take 1 tablet by mouth daily.   Yes [provider]  Multiple Vitamin (MULTIVITAMIN) capsule Take 1 capsule by mouth daily.   Yes [provider]  niacin 500 MG tablet Take 500 mg by mouth at bedtime.   Yes [provider]  pravastatin (PRAVACHOL) 20 MG tablet Take 20 mg by mouth daily.   Yes [provider]  simvastatin (ZOCOR) 20 MG tablet Take 20 mg by mouth daily.   Yes [provider]  tetrahydrozoline (VISINE) 0.05 % ophthalmic solution    Yes [provider]  cetirizine (ZYRTEC) 10 MG tablet Take 10 mg by mouth daily.    [provider]  cetirizine-pseudoephedrine (ZYRTEC-D) 5-120 MG tablet Take 1 tablet by mouth 2 (two) times daily. 02/12/16   Hassan RowanWade, Eugene, MD  desoximetasone (TOPICORT) 0.25 % cream Apply 1 application topically 2 (two) times daily.    [provider]  ferrous sulfate 325 (65 FE) MG EC tablet Take 325 mg by mouth 3 (three) times daily with meals.    [provider]  levofloxacin (LEVAQUIN) 500 MG tablet Take 1 tablet (500 mg total) by mouth daily. Patient not taking: Reported on 04/22/2018 12/22/17   Renford DillsMiller, Lindsey, NP  montelukast (SINGULAIR) 10 MG tablet Take 10 mg by mouth at bedtime.  [provider]    Allergies as of 12/05/2018 - Review Complete 04/28/2018  Allergen Reaction Noted  . Amlodipine  02/12/2016  . Atorvastatin  02/12/2016  . Penicillins  02/12/2016  . Rosuvastatin  02/12/2016  . Sulfa antibiotics Hives 02/12/2016    Family History  Problem Relation Age of Onset  . Cancer Mother   . Cancer Father   . Breast cancer Neg Hx     Social History   Socioeconomic History  . Marital status: Married    Spouse name: Not on file  . Number of children: Not on file  . Years of education: Not on file  . Highest education level: Not on file   Occupational History  . Not on file  Social Needs  . Financial resource strain: Not on file  . Food insecurity:    Worry: Not on file    Inability: Not on file  . Transportation needs:    Medical: Not on file    Non-medical: Not on file  Tobacco Use  . Smoking status: Never Smoker  . Smokeless tobacco: Never Used  Substance and Sexual Activity  . Alcohol use: No  . Drug use: No  . Sexual activity: Not on file  Lifestyle  . Physical activity:    Days per week: Not on file    Minutes per session: Not on file  . Stress: Not on file  Relationships  . Social connections:    Talks on phone: Not on file    Gets together: Not on file    Attends religious service: Not on file    Active member of club or organization: Not on file    Attends meetings of clubs or organizations: Not on file    Relationship status: Not on file  . Intimate partner violence:    Fear of current or ex partner: Not on file    Emotionally abused: Not on file    Physically abused: Not on file    Forced sexual activity: Not on file  Other Topics Concern  . Not on file  Social History Narrative  . Not on file    Review of Systems: See HPI, otherwise negative ROS  Physical Exam: BP (!) 185/69   Pulse 76   Temp 98.3 F (36.8 C) (Tympanic)   Resp 18   Ht 5\' 3"  (1.6 m)   Wt 89.4 kg   SpO2 100%   BMI 34.90 kg/m  General:   Alert,  pleasant and cooperative in NAD Head:  Normocephalic and atraumatic. Neck:  Supple; no masses or thyromegaly. Lungs:  Clear throughout to auscultation.    Heart:  Regular rate and rhythm. Abdomen:  Soft, nontender and nondistended. Normal bowel sounds, without guarding, and without rebound.   Neurologic:  Alert and  oriented x4;  grossly normal neurologically.  Impression/Plan: Becky LeysNancy Rone Niess is here for an endoscopy and colon[facility].  Risks, benefits, limitations, and alternatives regarding  endoscopy and  colonoscopy have been reviewed with the patient.  Questions have been answered.  All parties agreeable.   Lynnae PrudeELLIOTT, Chinmay Squier, MD  01/29/2019, 11:35 AM

## 2019-01-29 NOTE — Transfer of Care (Signed)
Immediate Anesthesia Transfer of Care Note  Patient: Becky Reese  Procedure(s) Performed: COLONOSCOPY WITH PROPOFOL (N/A ) ESOPHAGOGASTRODUODENOSCOPY (EGD) WITH PROPOFOL (N/A )  Patient Location: PACU and Endoscopy Unit  Anesthesia Type:General  Level of Consciousness: drowsy  Airway & Oxygen Therapy: Patient Spontanous Breathing and Patient connected to nasal cannula oxygen  Post-op Assessment: Report given to RN and Post -op Vital signs reviewed and stable  Post vital signs: Reviewed and stable  Last Vitals:  Vitals Value Taken Time  BP 139/117 01/29/2019 12:12 PM  Temp    Pulse 84 01/29/2019 12:13 PM  Resp 27 01/29/2019 12:13 PM  SpO2 100 % 01/29/2019 12:13 PM  Vitals shown include unvalidated device data.  Last Pain:  Vitals:   01/29/19 1031  TempSrc: Tympanic  PainSc: 0-No pain         Complications: No apparent anesthesia complications

## 2019-01-29 NOTE — Op Note (Signed)
[facility] Gastroenterology Patient Name: Becky Reese Procedure Date: 01/29/2019 11:28 AM MRN: [id number] Account #: [id number] Date of Birth: 1946/08/04 Admit Type: Outpatient Age: 73 Room: [facility] ENDO ROOM 3 Gender: Female Note Status: Finalized Procedure:            Colonoscopy Indications:          High risk colon cancer surveillance: Personal history                        of colonic polyps, Iron deficiency anemia Providers:            Scot Jun, MD Medicines:            Propofol per Anesthesia Complications:        No immediate complications. Procedure:            Pre-Anesthesia Assessment:                       - After reviewing the risks and benefits, the patient                        was deemed in satisfactory condition to undergo the                        procedure.                       After obtaining informed consent, the colonoscope was                        passed under direct vision. Throughout the procedure,                        the patient's blood pressure, pulse, and oxygen                        saturations were monitored continuously. The                        Colonoscope was introduced through the anus and                        advanced to the the cecum, identified by appendiceal                        orifice and ileocecal valve. The colonoscopy was                        performed without difficulty. The patient tolerated the                        procedure well. The quality of the bowel preparation                        was good. Findings:      Internal hemorrhoids were found during endoscopy. The hemorrhoids were       small and Grade I (internal hemorrhoids that do not prolapse).      The colon was well preped.      The exam was otherwise without abnormality. Impression:           - Internal hemorrhoids.                       -  The entire examined colon is normal.                       - The examination was otherwise  normal.                       - No specimens collected. Recommendation:       - The findings and recommendations were discussed with                        the patient's family. Likely need to go on iron if not                        already on. Scot Junobert T Vitor Overbaugh, MD 01/29/2019 12:11:12 PM This report has been signed electronically. Number of Addenda: 0 Note Initiated On: 01/29/2019 11:28 AM Scope Withdrawal Time: 0 hours 8 minutes 5 seconds  Total Procedure Duration: 0 hours 12 minutes 8 seconds       [facility]

## 2019-01-30 LAB — SURGICAL PATHOLOGY

## 2019-01-30 NOTE — Anesthesia Postprocedure Evaluation (Signed)
Anesthesia Post Note  Patient: Becky Reese  Procedure(s) Performed: COLONOSCOPY WITH PROPOFOL (N/A ) ESOPHAGOGASTRODUODENOSCOPY (EGD) WITH PROPOFOL (N/A )  Patient location during evaluation: PACU Anesthesia Type: General Level of consciousness: awake and alert Pain management: pain level controlled Vital Signs Assessment: post-procedure vital signs reviewed and stable Respiratory status: spontaneous breathing, nonlabored ventilation, respiratory function stable and patient connected to nasal cannula oxygen Cardiovascular status: blood pressure returned to baseline and stable Postop Assessment: no apparent nausea or vomiting Anesthetic complications: no     Last Vitals:  Vitals:   01/29/19 1244 01/29/19 1254  BP: (!) 149/76 (!) 171/75  Pulse:    Resp:    Temp:    SpO2:      Last Pain:  Vitals:   01/30/19 0758  TempSrc:   PainSc: 0-No pain                 Jovita Gamma

## 2019-04-28 ENCOUNTER — Ambulatory Visit (INDEPENDENT_AMBULATORY_CARE_PROVIDER_SITE_OTHER): Payer: Medicare HMO

## 2019-04-28 ENCOUNTER — Ambulatory Visit (INDEPENDENT_AMBULATORY_CARE_PROVIDER_SITE_OTHER): Payer: Medicare HMO | Admitting: Nurse Practitioner

## 2019-04-28 ENCOUNTER — Encounter (INDEPENDENT_AMBULATORY_CARE_PROVIDER_SITE_OTHER): Payer: Self-pay | Admitting: Nurse Practitioner

## 2019-04-28 ENCOUNTER — Other Ambulatory Visit: Payer: Self-pay

## 2019-04-28 VITALS — BP 172/90 | HR 71 | Resp 10 | Ht 63.0 in | Wt 206.0 lb

## 2019-04-28 DIAGNOSIS — J449 Chronic obstructive pulmonary disease, unspecified: Secondary | ICD-10-CM | POA: Diagnosis not present

## 2019-04-28 DIAGNOSIS — Z7982 Long term (current) use of aspirin: Secondary | ICD-10-CM

## 2019-04-28 DIAGNOSIS — I10 Essential (primary) hypertension: Secondary | ICD-10-CM | POA: Diagnosis not present

## 2019-04-28 DIAGNOSIS — E782 Mixed hyperlipidemia: Secondary | ICD-10-CM | POA: Diagnosis not present

## 2019-04-28 DIAGNOSIS — I6523 Occlusion and stenosis of bilateral carotid arteries: Secondary | ICD-10-CM

## 2019-04-28 DIAGNOSIS — Z79899 Other long term (current) drug therapy: Secondary | ICD-10-CM

## 2019-04-28 NOTE — Progress Notes (Signed)
SUBJECTIVE:  Patient ID: Becky Reese, female    DOB: 09-25-1946, 73 y.o.   MRN: [id number] Chief Complaint  Patient presents with  . Follow-up    HPI  Becky Reese is a 73 y.o. female The patient is seen for follow up evaluation of carotid stenosis. The carotid stenosis followed by ultrasound.   The patient denies amaurosis fugax. There is no recent history of TIA symptoms or focal motor deficits. There is no prior documented CVA.  The patient is taking enteric-coated aspirin 81 mg daily.  There is no history of migraine headaches. There is no history of seizures.  The patient has a history of coronary artery disease, no recent episodes of angina or shortness of breath. The patient denies PAD or claudication symptoms. There is a history of hyperlipidemia which is being treated with a statin.    Carotid Duplex done today shows 40-59% RICA stenosis with 60-79% LICA.  This is an increase compared to last study in 04/22/2018.  Past Medical History:  Diagnosis Date  . Asthma   . Chronic kidney disease   . Diabetes mellitus without complication (HCC)   . Hemorrhoids   . Hyperlipidemia   . Hypertension   . Murmur, cardiac   . Osteopenia     Past Surgical History:  Procedure Laterality Date  . ABDOMINAL HYSTERECTOMY     PARTIAL  . BREAST CYST EXCISION Right    NEG  . BREAST SURGERY    . COLONOSCOPY    . COLONOSCOPY WITH PROPOFOL N/A 01/29/2019   Procedure: COLONOSCOPY WITH PROPOFOL;  Surgeon: Scot Jun, MD;  Location: [facility] ENDOSCOPY;  Service: Endoscopy;  Laterality: N/A;  . ESOPHAGOGASTRODUODENOSCOPY (EGD) WITH PROPOFOL N/A 01/29/2019   Procedure: ESOPHAGOGASTRODUODENOSCOPY (EGD) WITH PROPOFOL;  Surgeon: Scot Jun, MD;  Location: [facility] ENDOSCOPY;  Service: Endoscopy;  Laterality: N/A;    Social History   Socioeconomic History  . Marital status: Married    Spouse name: Not on file  . Number of children: Not on file  . Years of education: Not  on file  . Highest education level: Not on file  Occupational History  . Not on file  Social Needs  . Financial resource strain: Not on file  . Food insecurity:    Worry: Not on file    Inability: Not on file  . Transportation needs:    Medical: Not on file    Non-medical: Not on file  Tobacco Use  . Smoking status: Never Smoker  . Smokeless tobacco: Never Used  Substance and Sexual Activity  . Alcohol use: No  . Drug use: No  . Sexual activity: Not on file  Lifestyle  . Physical activity:    Days per week: Not on file    Minutes per session: Not on file  . Stress: Not on file  Relationships  . Social connections:    Talks on phone: Not on file    Gets together: Not on file    Attends religious service: Not on file    Active member of club or organization: Not on file    Attends meetings of clubs or organizations: Not on file    Relationship status: Not on file  . Intimate partner violence:    Fear of current or ex partner: Not on file    Emotionally abused: Not on file    Physically abused: Not on file    Forced sexual activity: Not on file  Other Topics Concern  . Not  on file  Social History Narrative  . Not on file    Family History  Problem Relation Age of Onset  . Cancer Mother   . Cancer Father   . Breast cancer Neg Hx     Allergies  Allergen Reactions  . Amlodipine   . Atorvastatin     Muscle pain   . Penicillins     Hives   . Rosuvastatin     Muscle Pain   . Simvastatin   . Sulfa Antibiotics Hives     Review of Systems   Review of Systems: Negative Unless Checked Constitutional: [] Weight loss  [] Fever  [] Chills Cardiac: [] Chest pain   []  Atrial Fibrillation  [] Palpitations   [] Shortness of breath when laying flat   [] Shortness of breath with exertion. [] Shortness of breath at rest Vascular:  [] Pain in legs with walking   [] Pain in legs with standing [] Pain in legs when laying flat   [] Claudication    [] Pain in feet when laying flat     [] History of DVT   [] Phlebitis   [] Swelling in legs   [] Varicose veins   [] Non-healing ulcers Pulmonary:   [] Uses home oxygen   [] Productive cough   [] Hemoptysis   [] Wheeze  [x] COPD   [] Asthma Neurologic:  [] Dizziness   [] Seizures  [] Blackouts [] History of stroke   [] History of TIA  [] Aphasia   [] Temporary Blindness   [] Weakness or numbness in arm   [] Weakness or numbness in leg Musculoskeletal:   [] Joint swelling   [] Joint pain   [] Low back pain  []  History of Knee Replacement [] Arthritis [] back Surgeries  []  Spinal Stenosis    Hematologic:  [] Easy bruising  [] Easy bleeding   [] Hypercoagulable state   [] Anemic Gastrointestinal:  [] Diarrhea   [] Vomiting  [] Gastroesophageal reflux/heartburn   [] Difficulty swallowing. [] Abdominal pain Genitourinary:  [] Chronic kidney disease   [] Difficult urination  [] Anuric   [] Blood in urine [] Frequent urination  [] Burning with urination   [] Hematuria Skin:  [] Rashes   [] Ulcers [] Wounds Psychological:  [] History of anxiety   []  History of major depression  []  Memory Difficulties      OBJECTIVE:   Physical Exam  BP (!) 172/90 (BP Location: Left Wrist, Patient Position: Sitting, Cuff Size: Small)   Pulse 71   Resp 10   Ht 5\' 3"  (1.6 m)   Wt 206 lb (93.4 kg)   BMI 36.49 kg/m   Gen: WD/WN, NAD Head: [place]/AT, No temporalis wasting.  Ear/Nose/Throat: Hearing grossly intact, nares w/o erythema or drainage Eyes: PER, EOMI, sclera nonicteric.  Neck: Supple, no masses.  No JVD.  Pulmonary:  Good air movement, no use of accessory muscles.  Cardiac: RRR Vascular:  Vessel Right Left  Radial Palpable Palpable   Gastrointestinal: soft, non-distended. No guarding/no peritoneal signs.  Musculoskeletal: M/S 5/5 throughout.  No deformity or atrophy.  Neurologic: Pain and light touch intact in extremities.  Symmetrical.  Speech is fluent. Motor exam as listed above. Psychiatric: Judgment intact, Mood & affect appropriate for pt's clinical situation. Dermatologic: No  Venous rashes. No Ulcers Noted.  No changes consistent with cellulitis. Lymph : No Cervical lymphadenopathy, no lichenification or skin changes of chronic lymphedema.       ASSESSMENT AND PLAN:  1. Bilateral carotid artery stenosis Recommend:  Given the patient's asymptomatic subcritical stenosis no further invasive testing or surgery at this time.  Carotid Duplex done today shows 40-59% RICA stenosis with 60-79% LICA.  This is an increase compared to last study in 04/22/2018.  Continue  antiplatelet therapy as prescribed Continue management of CAD, HTN and Hyperlipidemia Healthy heart diet,  encouraged exercise at least 4 times per week Follow up in 6 months with duplex ultrasound and physical exam  - VAS US CAROTID; Future  2. Chronic obstructive pulmonary disease, unspecified COPD type (HCC) Continue pulmonary medications and aerosols as already ordered, these medications have been reviewed and there are no changes at this time.    3. Mixed hyperlipidemia Continue statin as ordered and reviewed, no changes at this time   4. Essential hypertension Continue antihypertensive medications as already ordered, these medications have been reviewed and there are no changes at this time.    Current Outpatient Medications on File Prior to Visit  Medication Sig Dispense Refill  . albuterol (PROVENTIL) (2.5 MG/3ML) 0.083% nebulizer solution Take 2.5 mg by nebulization every 6 (six) hours as needed for wheezing or shortness of breath.    Marland Kitchen. aspirin 81 MG tablet Take 81 mg by mouth daily.    . beclomethasone (QVAR) 80 MCG/ACT inhaler Inhale into the lungs 2 (two) times daily.    . carboxymethylcellulose (REFRESH PLUS) 0.5 % SOLN 1 drop 3 (three) times daily as needed.    . carvedilol (COREG) 6.25 MG tablet Take 6.25 mg by mouth 2 (two) times daily with a meal.    . Cholecalciferol (VITAMIN D) 125 MCG (5000 UT) CAPS Take by mouth.    . Coenzyme Q10 (UBIDECARENONE) POWD by Does not apply  route.    . desoximetasone (TOPICORT) 0.25 % cream Apply 1 application topically 2 (two) times daily.    Marland Kitchen. ezetimibe (ZETIA) 10 MG tablet Take 10 mg by mouth daily.    . ferrous sulfate 325 (65 FE) MG EC tablet Take 325 mg by mouth 3 (three) times daily with meals.    . fluticasone (FLONASE) 50 MCG/ACT nasal spray Place into both nostrils daily.    . Fluticasone-Salmeterol (ADVAIR) 250-50 MCG/DOSE AEPB Inhale 1 puff into the lungs 2 (two) times daily.    Marland Kitchen. losartan (COZAAR) 100 MG tablet Take 100 mg by mouth daily.    . Multiple Vitamin (MULTIVITAMIN) capsule Take 1 capsule by mouth daily.    . niacin 500 MG tablet Take 500 mg by mouth at bedtime.    . pravastatin (PRAVACHOL) 20 MG tablet Take 10 mg by mouth daily.     Marland Kitchen. tetrahydrozoline (VISINE) 0.05 % ophthalmic solution     . cetirizine (ZYRTEC) 10 MG tablet Take 10 mg by mouth daily.    . cetirizine-pseudoephedrine (ZYRTEC-D) 5-120 MG tablet Take 1 tablet by mouth 2 (two) times daily. (Patient not taking: Reported on 04/28/2019) 60 tablet 0  . Fluticasone-Salmeterol (ADVAIR) 100-50 MCG/DOSE AEPB Inhale 1 puff into the lungs 2 (two) times daily.    Marland Kitchen. losartan-hydrochlorothiazide (HYZAAR) 100-12.5 MG tablet Take 1 tablet by mouth daily.    . montelukast (SINGULAIR) 10 MG tablet Take 10 mg by mouth at bedtime.    . simvastatin (ZOCOR) 20 MG tablet Take 20 mg by mouth daily.     No current facility-administered medications on file prior to visit.     There are no Patient Instructions on file for this visit. Return in about 6 months (around 10/29/2019).   Georgiana SpinnerFallon E Brown, NP  This note was completed with Office managerDragon Dictation.  Any errors are purely unintentional.

## 2019-06-10 DIAGNOSIS — K259 Gastric ulcer, unspecified as acute or chronic, without hemorrhage or perforation: Secondary | ICD-10-CM | POA: Insufficient documentation

## 2019-06-24 ENCOUNTER — Other Ambulatory Visit: Payer: Self-pay | Admitting: Family Medicine

## 2019-06-24 DIAGNOSIS — Z1231 Encounter for screening mammogram for malignant neoplasm of breast: Secondary | ICD-10-CM

## 2019-07-13 IMAGING — MG MM DIGITAL SCREENING BILAT W/ TOMO W/ CAD
8 of 12 series · 8 of 28 positions shown · non-contrast
Comparison: Previous exam(s).

CLINICAL DATA: Screening.

EXAM:
DIGITAL SCREENING BILATERAL MAMMOGRAM WITH TOMO AND CAD

[R MLO]
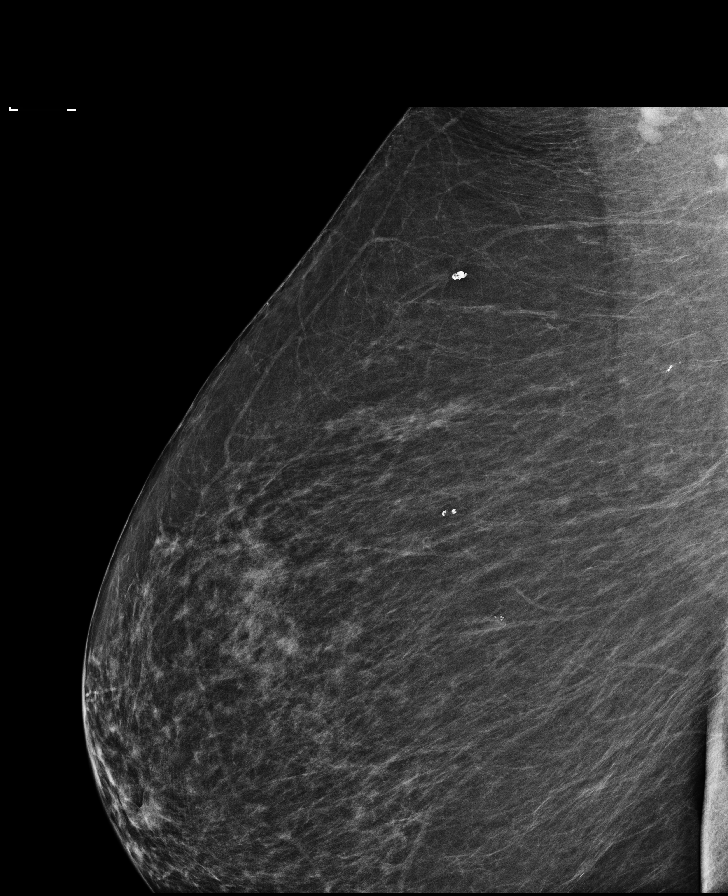

[L MLO]
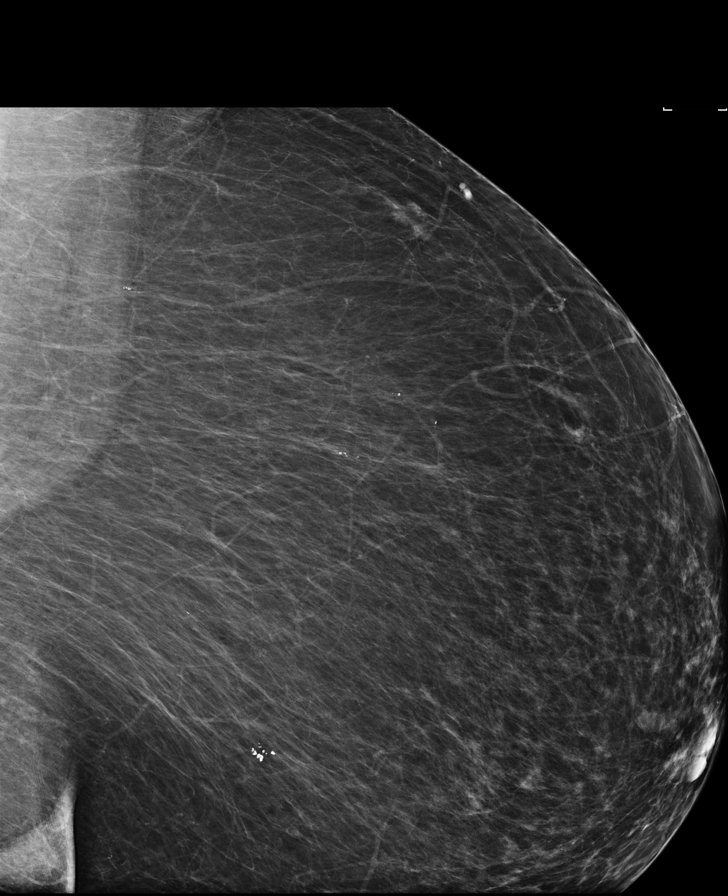

[R CC]
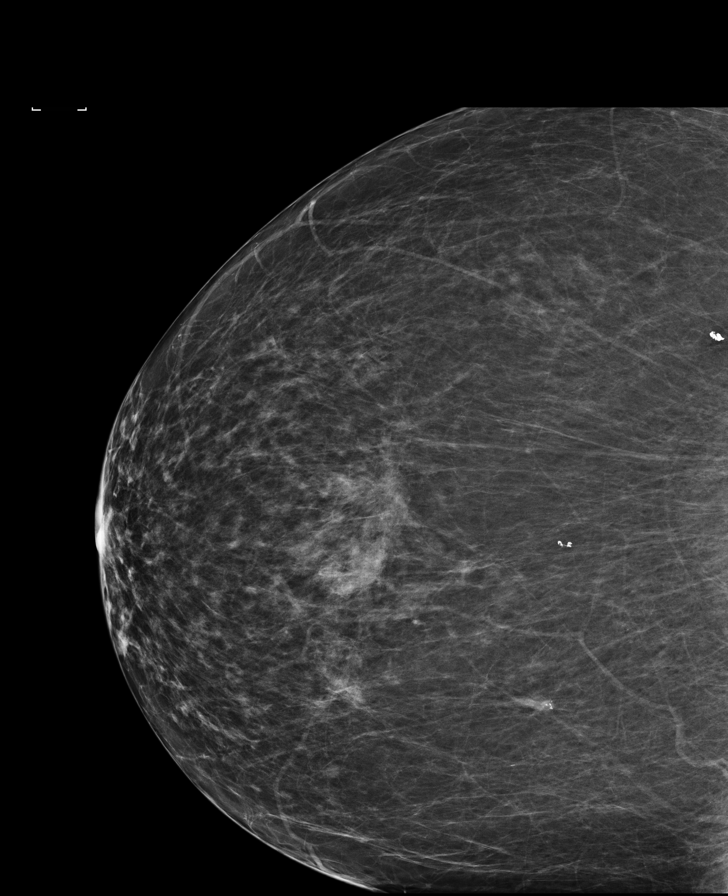

[L CC]
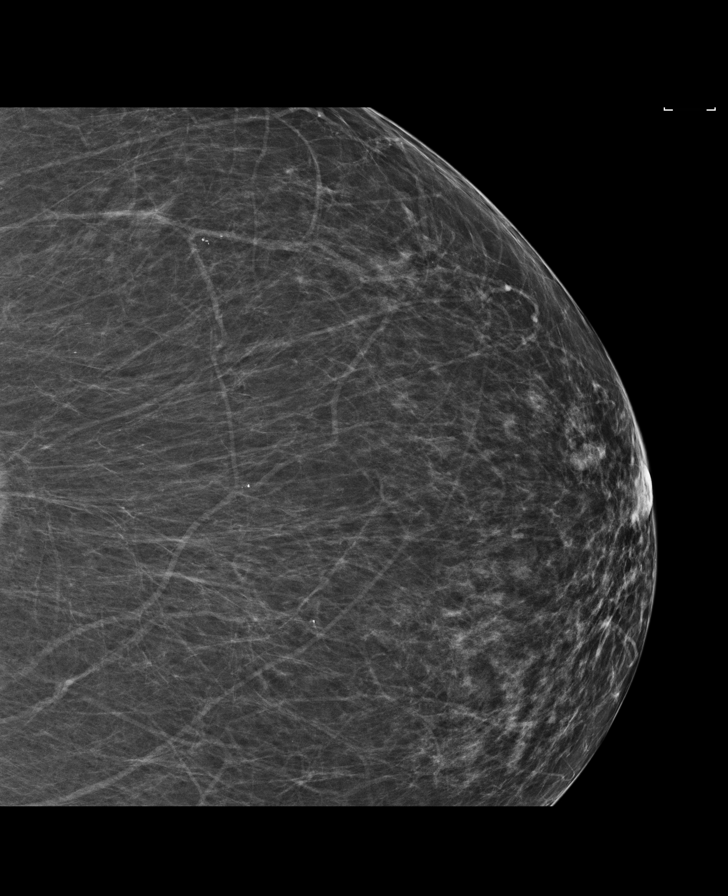

[L CC synth-2D]
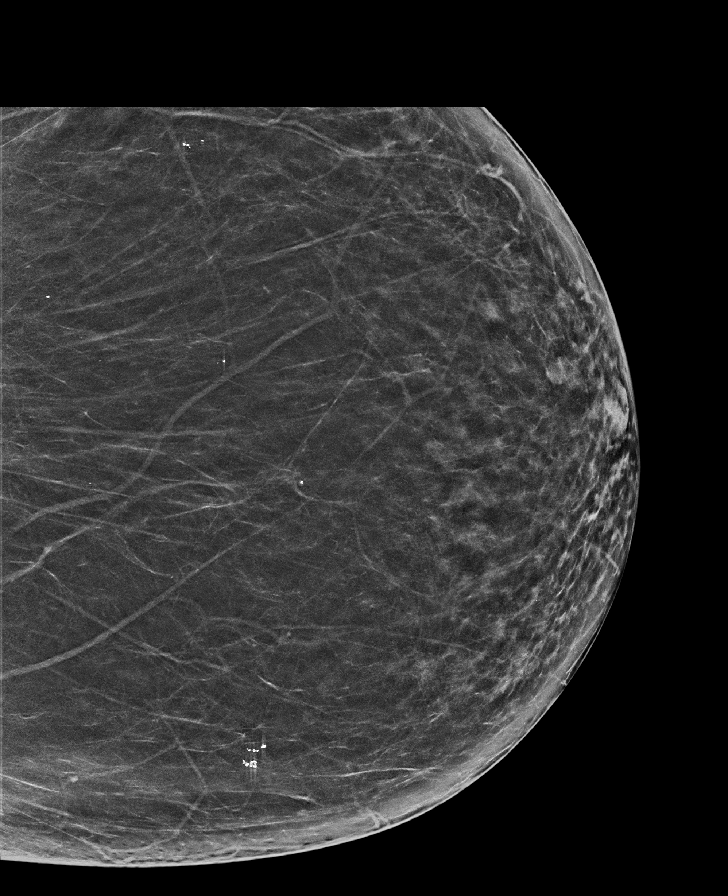

[R MLO synth-2D]
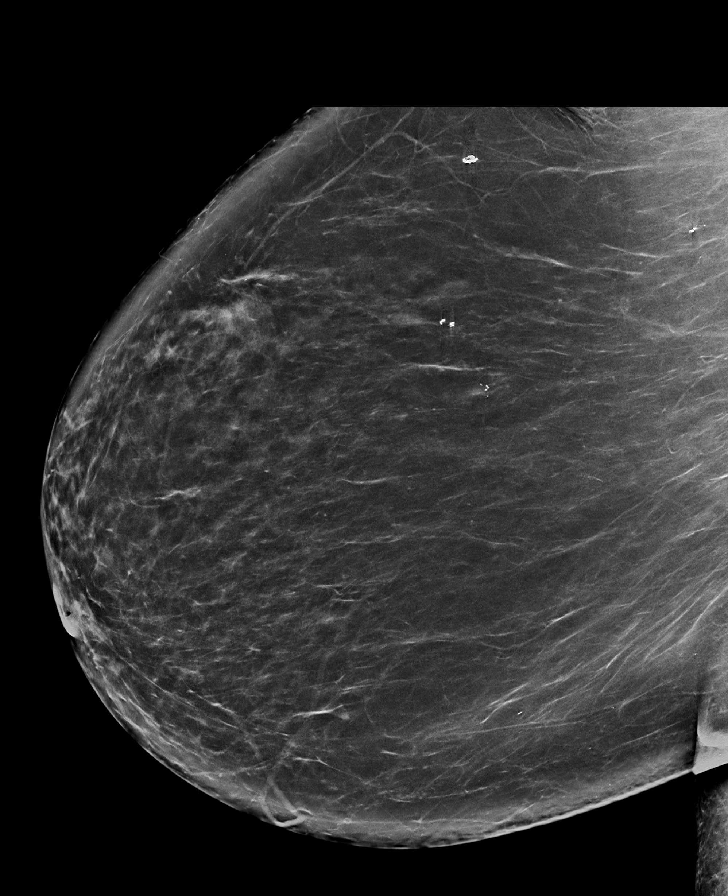

[L MLO synth-2D]
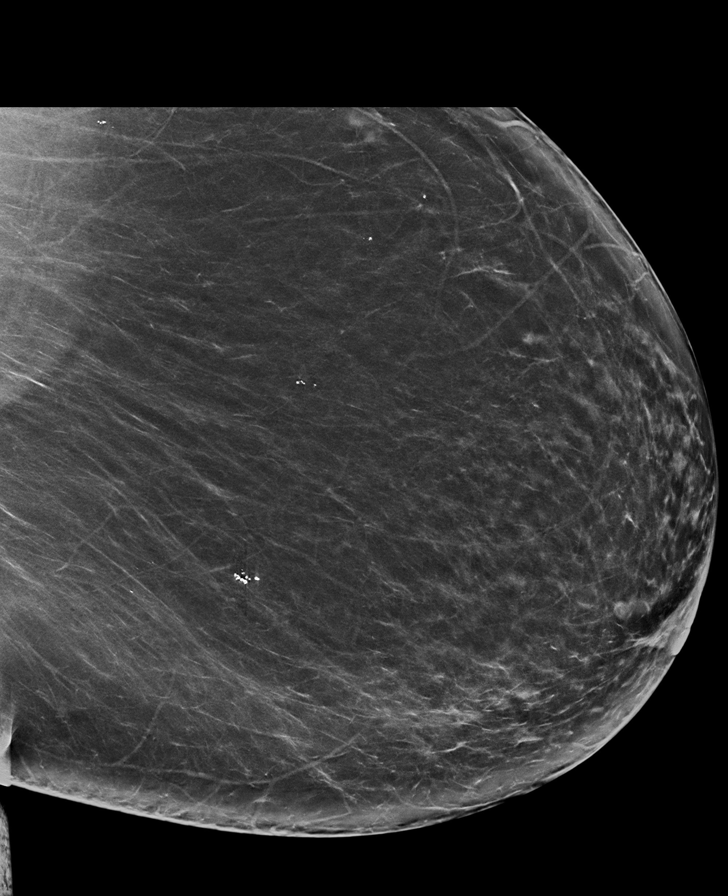

[R CC synth-2D]
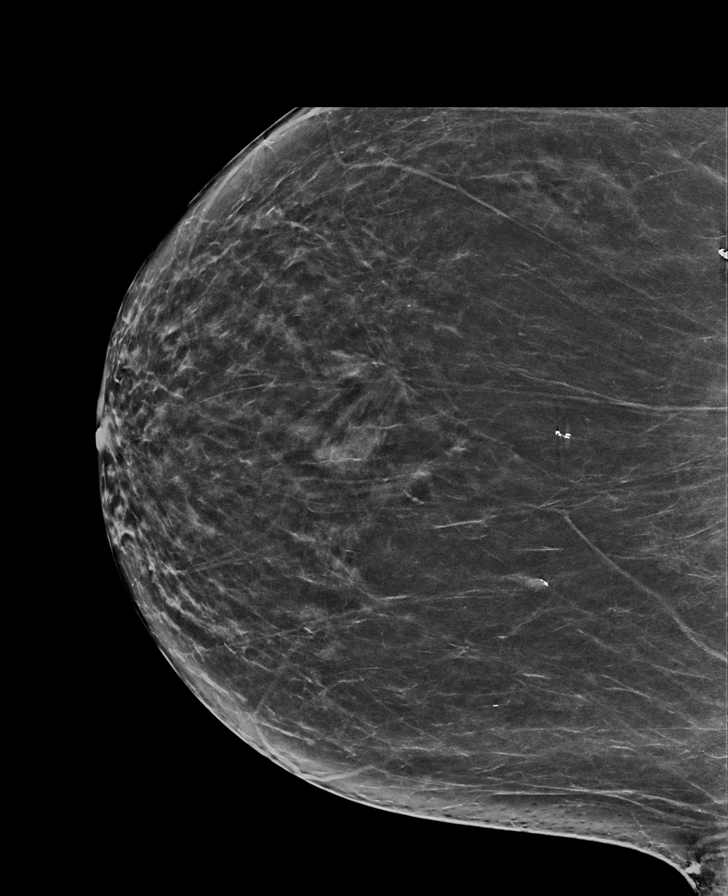

[8 of 28 positions shown; findings below may reference images not displayed]

ACR Breast Density Category b: There are scattered areas of
fibroglandular density.
FINDINGS: There are no findings suspicious for malignancy. Images were
processed with CAD.
IMPRESSION: No mammographic evidence of malignancy. A result letter of this
screening mammogram will be mailed directly to the patient.

RECOMMENDATION:
Screening mammogram in one year. (Code:[id number])

BI-RADS CATEGORY  1: Negative.

## 2019-07-15 ENCOUNTER — Ambulatory Visit
Admission: RE | Admit: 2019-07-15 | Discharge: 2019-07-15 | Disposition: A | Discharge status: Home / Self Care | Payer: Medicare HMO | Source: Ambulatory Visit | Attending: Family Medicine | Admitting: Family Medicine

## 2019-07-15 ENCOUNTER — Other Ambulatory Visit: Payer: Self-pay

## 2019-07-15 DIAGNOSIS — Z1231 Encounter for screening mammogram for malignant neoplasm of breast: Secondary | ICD-10-CM | POA: Diagnosis not present

## 2019-08-05 ENCOUNTER — Other Ambulatory Visit: Payer: Self-pay | Admitting: Family Medicine

## 2019-08-05 DIAGNOSIS — M949 Disorder of cartilage, unspecified: Secondary | ICD-10-CM

## 2019-08-05 DIAGNOSIS — M899 Disorder of bone, unspecified: Secondary | ICD-10-CM

## 2019-09-04 ENCOUNTER — Other Ambulatory Visit: Payer: Self-pay

## 2019-09-04 ENCOUNTER — Ambulatory Visit
Admission: RE | Admit: 2019-09-04 | Discharge: 2019-09-04 | Disposition: A | Discharge status: Home / Self Care | Payer: Medicare HMO | Source: Ambulatory Visit | Attending: Family Medicine | Admitting: Family Medicine

## 2019-09-04 DIAGNOSIS — I10 Essential (primary) hypertension: Secondary | ICD-10-CM | POA: Diagnosis not present

## 2019-09-04 DIAGNOSIS — M85851 Other specified disorders of bone density and structure, right thigh: Secondary | ICD-10-CM | POA: Diagnosis not present

## 2019-09-04 DIAGNOSIS — M949 Disorder of cartilage, unspecified: Secondary | ICD-10-CM | POA: Diagnosis present

## 2019-09-04 DIAGNOSIS — M899 Disorder of bone, unspecified: Secondary | ICD-10-CM | POA: Diagnosis present

## 2019-10-30 ENCOUNTER — Encounter (INDEPENDENT_AMBULATORY_CARE_PROVIDER_SITE_OTHER): Payer: Medicare HMO

## 2019-10-30 ENCOUNTER — Ambulatory Visit (INDEPENDENT_AMBULATORY_CARE_PROVIDER_SITE_OTHER): Payer: Medicare HMO | Admitting: Nurse Practitioner

## 2020-02-24 ENCOUNTER — Other Ambulatory Visit (INDEPENDENT_AMBULATORY_CARE_PROVIDER_SITE_OTHER): Payer: Self-pay | Admitting: Vascular Surgery

## 2020-02-24 DIAGNOSIS — I679 Cerebrovascular disease, unspecified: Secondary | ICD-10-CM

## 2020-02-25 ENCOUNTER — Other Ambulatory Visit: Payer: Self-pay

## 2020-02-25 ENCOUNTER — Encounter (INDEPENDENT_AMBULATORY_CARE_PROVIDER_SITE_OTHER): Payer: Self-pay | Admitting: Nurse Practitioner

## 2020-02-25 ENCOUNTER — Encounter (INDEPENDENT_AMBULATORY_CARE_PROVIDER_SITE_OTHER): Payer: Self-pay

## 2020-02-25 ENCOUNTER — Ambulatory Visit (INDEPENDENT_AMBULATORY_CARE_PROVIDER_SITE_OTHER): Payer: Medicare HMO | Admitting: Nurse Practitioner

## 2020-02-25 ENCOUNTER — Ambulatory Visit (INDEPENDENT_AMBULATORY_CARE_PROVIDER_SITE_OTHER): Payer: Medicare HMO

## 2020-02-25 VITALS — BP 178/72 | HR 75 | Resp 16 | Wt 195.0 lb

## 2020-02-25 DIAGNOSIS — I679 Cerebrovascular disease, unspecified: Secondary | ICD-10-CM | POA: Diagnosis not present

## 2020-02-25 DIAGNOSIS — I771 Stricture of artery: Secondary | ICD-10-CM

## 2020-02-25 DIAGNOSIS — I6523 Occlusion and stenosis of bilateral carotid arteries: Secondary | ICD-10-CM | POA: Diagnosis not present

## 2020-02-25 DIAGNOSIS — J449 Chronic obstructive pulmonary disease, unspecified: Secondary | ICD-10-CM

## 2020-02-26 ENCOUNTER — Encounter (INDEPENDENT_AMBULATORY_CARE_PROVIDER_SITE_OTHER): Payer: Self-pay | Admitting: Nurse Practitioner

## 2020-02-26 NOTE — Progress Notes (Signed)
SUBJECTIVE:  Patient ID: Becky Reese, female    DOB: October 15, 1946, 74 y.o.   MRN: [id number] Chief Complaint  Patient presents with  . Follow-up    ultrasound follow up    HPI  Becky Reese is a 74 y.o. female The patient is seen for follow up evaluation of carotid stenosis. The carotid stenosis followed by ultrasound.   The patient denies amaurosis fugax. There is no recent history of TIA symptoms or focal motor deficits. There is no prior documented CVA.  The patient is taking enteric-coated aspirin 81 mg daily.  There is no history of migraine headaches. There is no history of seizures.  The patient has a history of coronary artery disease, no recent episodes of angina or shortness of breath. The patient denies PAD or claudication symptoms. There is a history of hyperlipidemia which is being treated with a statin.    Carotid Duplex done today shows 40 to 59% in the right internal carotid artery with a 1 to 39% stenosis in the left internal carotid artery.  Previous study done on 04/28/2019 showed 60 to 79% stenosis in the internal carotid artery with a 40 to 59% stenosis in the right internal carotid artery.  Past Medical History:  Diagnosis Date  . Asthma   . Chronic kidney disease   . Diabetes mellitus without complication (HCC)   . Hemorrhoids   . Hyperlipidemia   . Hypertension   . Murmur, cardiac   . Osteopenia     Past Surgical History:  Procedure Laterality Date  . ABDOMINAL HYSTERECTOMY     PARTIAL  . BREAST CYST EXCISION Right    NEG  . BREAST SURGERY    . COLONOSCOPY    . COLONOSCOPY WITH PROPOFOL N/A 01/29/2019   Procedure: COLONOSCOPY WITH PROPOFOL;  Surgeon: Scot Jun, MD;  Location: [facility] ENDOSCOPY;  Service: Endoscopy;  Laterality: N/A;  . ESOPHAGOGASTRODUODENOSCOPY (EGD) WITH PROPOFOL N/A 01/29/2019   Procedure: ESOPHAGOGASTRODUODENOSCOPY (EGD) WITH PROPOFOL;  Surgeon: Scot Jun, MD;  Location: [facility] ENDOSCOPY;  Service:  Endoscopy;  Laterality: N/A;    Social History   Socioeconomic History  . Marital status: Married    Spouse name: Not on file  . Number of children: Not on file  . Years of education: Not on file  . Highest education level: Not on file  Occupational History  . Not on file  Tobacco Use  . Smoking status: Never Smoker  . Smokeless tobacco: Never Used  Substance and Sexual Activity  . Alcohol use: No  . Drug use: No  . Sexual activity: Not on file  Other Topics Concern  . Not on file  Social History Narrative  . Not on file   Social Determinants of Health   Financial Resource Strain:   . Difficulty of Paying Living Expenses: Not on file  Food Insecurity:   . Worried About Programme researcher, broadcasting/film/video in the Last Year: Not on file  . Ran Out of Food in the Last Year: Not on file  Transportation Needs:   . Lack of Transportation (Medical): Not on file  . Lack of Transportation (Non-Medical): Not on file  Physical Activity:   . Days of Exercise per Week: Not on file  . Minutes of Exercise per Session: Not on file  Stress:   . Feeling of Stress : Not on file  Social Connections:   . Frequency of Communication with Friends and Family: Not on file  . Frequency of Social Gatherings  with Friends and Family: Not on file  . Attends Religious Services: Not on file  . Active Member of Clubs or Organizations: Not on file  . Attends Banker Meetings: Not on file  . Marital Status: Not on file  Intimate Partner Violence:   . Fear of Current or Ex-Partner: Not on file  . Emotionally Abused: Not on file  . Physically Abused: Not on file  . Sexually Abused: Not on file    Family History  Problem Relation Age of Onset  . Cancer Mother   . Cancer Father   . Breast cancer Neg Hx     Allergies  Allergen Reactions  . Amlodipine   . Atorvastatin     Muscle pain   . Penicillins     Hives   . Rosuvastatin     Muscle Pain   . Simvastatin   . Sulfa Antibiotics Hives      Review of Systems   Review of Systems: Negative Unless Checked Constitutional: [] Weight loss  [] Fever  [] Chills Cardiac: [] Chest pain   []  Atrial Fibrillation  [] Palpitations   [] Shortness of breath when laying flat   [] Shortness of breath with exertion. [] Shortness of breath at rest Vascular:  [] Pain in legs with walking   [] Pain in legs with standing [] Pain in legs when laying flat   [] Claudication    [] Pain in feet when laying flat    [] History of DVT   [] Phlebitis   [] Swelling in legs   [] Varicose veins   [] Non-healing ulcers Pulmonary:   [] Uses home oxygen   [] Productive cough   [] Hemoptysis   [] Wheeze  [x] COPD   [x] Asthma Neurologic:  [] Dizziness   [] Seizures  [] Blackouts [] History of stroke   [] History of TIA  [] Aphasia   [] Temporary Blindness   [] Weakness or numbness in arm   [] Weakness or numbness in leg Musculoskeletal:   [] Joint swelling   [] Joint pain   [] Low back pain  []  History of Knee Replacement [] Arthritis [] back Surgeries  []  Spinal Stenosis    Hematologic:  [] Easy bruising  [] Easy bleeding   [] Hypercoagulable state   [] Anemic Gastrointestinal:  [] Diarrhea   [] Vomiting  [] Gastroesophageal reflux/heartburn   [] Difficulty swallowing. [] Abdominal pain Genitourinary:  [] Chronic kidney disease   [] Difficult urination  [] Anuric   [] Blood in urine [] Frequent urination  [] Burning with urination   [] Hematuria Skin:  [] Rashes   [] Ulcers [] Wounds Psychological:  [] History of anxiety   []  History of major depression  []  Memory Difficulties      OBJECTIVE:   Physical Exam  BP (!) 178/72 (BP Location: Right Arm)   Pulse 75   Resp 16   Wt 195 lb (88.5 kg)   BMI 34.54 kg/m   Gen: WD/WN, NAD Head: [place]/AT, No temporalis wasting.  Ear/Nose/Throat: Hearing grossly intact, nares w/o erythema or drainage Eyes: PER, EOMI, sclera nonicteric.  Neck: Supple, no masses.  No JVD.  Pulmonary:  Good air movement, no use of accessory muscles.  Cardiac: RRR, murmur Vascular:  Right  carotid bruit Vessel Right Left  Radial Palpable Palpable   Gastrointestinal: soft, non-distended. No guarding/no peritoneal signs.  Musculoskeletal: M/S 5/5 throughout.  No deformity or atrophy.  Neurologic: Pain and light touch intact in extremities.  Symmetrical.  Speech is fluent. Motor exam as listed above. Psychiatric: Judgment intact, Mood & affect appropriate for pt's clinical situation. Dermatologic: No Venous rashes. No Ulcers Noted.  No changes consistent with cellulitis. Lymph : No Cervical lymphadenopathy, no lichenification or skin changes of  chronic lymphedema.       ASSESSMENT AND PLAN:  1. Bilateral carotid artery stenosis Recommend:  Given the patient's asymptomatic subcritical stenosis no further invasive testing or surgery at this time.  Carotid Duplex done today shows 40 to 59% in the right internal carotid artery with a 1 to 39% stenosis in the left internal carotid artery.  Previous study done on 04/28/2019 showed 60 to 79% stenosis in the internal carotid artery with a 40 to 59% stenosis in the right internal carotid artery.  Continue antiplatelet therapy as prescribed Continue management of CAD, HTN and Hyperlipidemia Healthy heart diet,  encouraged exercise at least 4 times per week Follow up in 6 months with duplex ultrasound and physical exam   While patient's velocities were on the lower end of the left internal carotid artery there is a significant difference between the 2 studies.  We will bring the patient back in 6 months to try to gain a more accurate look.  2. Subclavian artery stenosis (HCC) The patient has a stenotic left subclavian artery.  Patient does not complain of any significant pain numbness tingling today.  3. Chronic obstructive pulmonary disease, unspecified COPD type ([place]) Continue pulmonary medications and aerosols as already ordered, these medications have been reviewed and there are no changes at this time.     Current Outpatient  Medications on File Prior to Visit  Medication Sig Dispense Refill  . albuterol (PROVENTIL) (2.5 MG/3ML) 0.083% nebulizer solution Take 2.5 mg by nebulization every 6 (six) hours as needed for wheezing or shortness of breath.    Marland Kitchen amLODipine (NORVASC) 5 MG tablet Take 5 mg by mouth daily.    Marland Kitchen aspirin 81 MG tablet Take 81 mg by mouth daily.    . carboxymethylcellulose (REFRESH PLUS) 0.5 % SOLN 1 drop 3 (three) times daily as needed.    . carvedilol (COREG) 6.25 MG tablet Take 6.25 mg by mouth 2 (two) times daily with a meal.    . Cholecalciferol (VITAMIN D) 125 MCG (5000 UT) CAPS Take by mouth.    . Coenzyme Q10 (COQ10) 50 MG CAPS Take by mouth.    . ezetimibe (ZETIA) 10 MG tablet Take 10 mg by mouth daily.    . ferrous sulfate 325 (65 FE) MG EC tablet Take 325 mg by mouth 3 (three) times daily with meals.    . fluticasone (FLONASE) 50 MCG/ACT nasal spray Place into both nostrils daily.    . Fluticasone-Salmeterol (ADVAIR) 250-50 MCG/DOSE AEPB Inhale 1 puff into the lungs 2 (two) times daily.    . niacin 500 MG tablet Take 500 mg by mouth at bedtime.    Marland Kitchen omeprazole (PRILOSEC) 40 MG capsule Take by mouth.    . pravastatin (PRAVACHOL) 20 MG tablet Take 10 mg by mouth daily.     . beclomethasone (QVAR) 80 MCG/ACT inhaler Inhale into the lungs 2 (two) times daily.    . cetirizine (ZYRTEC) 10 MG tablet Take 10 mg by mouth daily.    . cetirizine-pseudoephedrine (ZYRTEC-D) 5-120 MG tablet Take 1 tablet by mouth 2 (two) times daily. (Patient not taking: Reported on 04/28/2019) 60 tablet 0  . Coenzyme Q10 (UBIDECARENONE) POWD by Does not apply route.    . desoximetasone (TOPICORT) 0.25 % cream Apply 1 application topically 2 (two) times daily.    . Fluticasone-Salmeterol (ADVAIR) 100-50 MCG/DOSE AEPB Inhale 1 puff into the lungs 2 (two) times daily.    Marland Kitchen losartan (COZAAR) 100 MG tablet Take 100 mg by mouth  daily.    . losartan-hydrochlorothiazide (HYZAAR) 100-12.5 MG tablet Take 1 tablet by mouth daily.     . montelukast (SINGULAIR) 10 MG tablet Take 10 mg by mouth at bedtime.    . Multiple Vitamin (MULTIVITAMIN) capsule Take 1 capsule by mouth daily.    . simvastatin (ZOCOR) 20 MG tablet Take 20 mg by mouth daily.    Marland Kitchen tetrahydrozoline (VISINE) 0.05 % ophthalmic solution      No current facility-administered medications on file prior to visit.    There are no Patient Instructions on file for this visit. No follow-ups on file.   Georgiana Spinner, NP  This note was completed with Office manager.  Any errors are purely unintentional.

## 2020-05-19 ENCOUNTER — Other Ambulatory Visit: Payer: Self-pay

## 2020-05-19 ENCOUNTER — Ambulatory Visit
Admission: EM | Admit: 2020-05-19 | Discharge: 2020-05-19 | Disposition: A | Discharge status: Home / Self Care | Payer: Medicare HMO | Attending: Family Medicine | Admitting: Family Medicine

## 2020-05-19 ENCOUNTER — Encounter: Payer: Self-pay | Admitting: Emergency Medicine

## 2020-05-19 DIAGNOSIS — J0100 Acute maxillary sinusitis, unspecified: Secondary | ICD-10-CM

## 2020-05-19 MED ORDER — DOXYCYCLINE HYCLATE 100 MG PO CAPS: 100.0000 mg | ORAL_CAPSULE | Freq: Two times a day (BID) | ORAL | 0 refills | Status: DC

## 2020-05-19 NOTE — ED Provider Notes (Signed)
[facility]    CSN: [id number] Arrival date & time: 05/19/20  4235  History   Chief Complaint Chief Complaint  Patient presents with  . Nasal Congestion  . Cough    HPI  74 year old female presents with the above complaints.  Patient reports a 1 week history of nasal congestion, sinus pain and pressure, and associated cough.  No documented fever.  No medications or interventions tried.  No relieving factors.  Patient is concerned that she has a sinus infection.  No reported sick contacts.  No other associated symptoms.  No other complaints.  Past Medical History:  Diagnosis Date  . Asthma   . Chronic kidney disease   . Diabetes mellitus without complication (HCC)   . Hemorrhoids   . Hyperlipidemia   . Hypertension   . Murmur, cardiac   . Osteopenia     Patient Active Problem List   Diagnosis Date Noted  . Hypercalcemia 10/16/2019  . Gastric erosions, unspecified ulcer chronicity 06/10/2019  . Carotid stenosis, asymptomatic, bilateral 08/12/2017  . Eczema 08/12/2017  . 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 Adverse effect of statin 06/21/2017  . Carotid stenosis 04/23/2017  . COPD (chronic obstructive pulmonary disease) (HCC) 04/23/2017  . Essential hypertension 04/23/2017  . Hyperlipidemia 04/23/2017  . Chronic kidney disease, stage 3 unspecified 03/08/2015  . Thyromegaly 03/08/2015  . Hemorrhoids 04/07/2014  . Asthma, mild persistent 03/10/2014  . Cardiac murmur, unspecified 03/06/2014  . Prediabetes 06/10/2013  . Allergic rhinitis 05/09/2013  . Asthma 05/09/2013  . Atrophic vaginitis 05/09/2013  . Iritis 05/09/2013  . Obesity 05/09/2013  . Osteopenia 05/09/2013  . Uveitis 05/09/2013    Past Surgical History:  Procedure Laterality Date  . ABDOMINAL HYSTERECTOMY     PARTIAL  . BREAST CYST EXCISION Right    NEG  . BREAST SURGERY    . COLONOSCOPY    . COLONOSCOPY WITH PROPOFOL N/A 01/29/2019   Procedure: COLONOSCOPY WITH PROPOFOL;   Surgeon: Scot Jun, MD;  Location: [facility] ENDOSCOPY;  Service: Endoscopy;  Laterality: N/A;  . ESOPHAGOGASTRODUODENOSCOPY (EGD) WITH PROPOFOL N/A 01/29/2019   Procedure: ESOPHAGOGASTRODUODENOSCOPY (EGD) WITH PROPOFOL;  Surgeon: Scot Jun, MD;  Location: [facility] ENDOSCOPY;  Service: Endoscopy;  Laterality: N/A;    OB History   No obstetric history on file.      Home Medications    Prior to Admission medications   Medication Sig Start Date End Date Taking? Authorizing Provider  albuterol (PROVENTIL) (2.5 MG/3ML) 0.083% nebulizer solution Take 2.5 mg by nebulization every 6 (six) hours as needed for wheezing or shortness of breath.    [provider]  amLODipine (NORVASC) 5 MG tablet Take 5 mg by mouth daily. 12/23/19   [provider]  aspirin 81 MG tablet Take 81 mg by mouth daily.    [provider]  beclomethasone (QVAR) 80 MCG/ACT inhaler Inhale into the lungs 2 (two) times daily.    [provider]  carboxymethylcellulose (REFRESH PLUS) 0.5 % SOLN 1 drop 3 (three) times daily as needed.    [provider]  carvedilol (COREG) 6.25 MG tablet Take 6.25 mg by mouth 2 (two) times daily with a meal.    [provider]  cetirizine (ZYRTEC) 10 MG tablet Take 10 mg by mouth daily.    [provider]  Cholecalciferol (VITAMIN D) 125 MCG (5000 UT) CAPS Take by mouth.    [provider]  Coenzyme Q10 (COQ10) 50 MG CAPS Take by mouth.  [provider]  Coenzyme Q10 (UBIDECARENONE) POWD by Does not apply route.    [provider]  desoximetasone (TOPICORT) 0.25 % cream Apply 1 application topically 2 (two) times daily.    [provider]  doxycycline (VIBRAMYCIN) 100 MG capsule Take 1 capsule (100 mg total) by mouth 2 (two) times daily. 05/19/20   Tommie Sams, DO  ezetimibe (ZETIA) 10 MG tablet Take 10 mg by mouth daily.    [provider]  ferrous sulfate 325 (65 FE) MG EC tablet  Take 325 mg by mouth 3 (three) times daily with meals.    [provider]  fluticasone (FLONASE) 50 MCG/ACT nasal spray Place into both nostrils daily.    [provider]  Fluticasone-Salmeterol (ADVAIR) 250-50 MCG/DOSE AEPB Inhale 1 puff into the lungs 2 (two) times daily.    [provider]  losartan-hydrochlorothiazide (HYZAAR) 100-12.5 MG tablet Take 1 tablet by mouth daily.    [provider]  montelukast (SINGULAIR) 10 MG tablet Take 10 mg by mouth at bedtime.    [provider]  Multiple Vitamin (MULTIVITAMIN) capsule Take 1 capsule by mouth daily.    [provider]  niacin 500 MG tablet Take 500 mg by mouth at bedtime.    [provider]  omeprazole (PRILOSEC) 40 MG capsule Take by mouth.    [provider]  pravastatin (PRAVACHOL) 20 MG tablet Take 10 mg by mouth daily.     [provider]  tetrahydrozoline (VISINE) 0.05 % ophthalmic solution     [provider]  losartan (COZAAR) 100 MG tablet Take 100 mg by mouth daily.  05/19/20  [provider]  simvastatin (ZOCOR) 20 MG tablet Take 20 mg by mouth daily.  05/19/20  [provider]    Family History Family History  Problem Relation Age of Onset  . Cancer Mother   . Cancer Father   . Breast cancer Neg Hx     Social History Social History   Tobacco Use  . Smoking status: Never Smoker  . Smokeless tobacco: Never Used  Substance Use Topics  . Alcohol use: No  . Drug use: No     Allergies   Amlodipine, Atorvastatin, Penicillins, Rosuvastatin, Simvastatin, and Sulfa antibiotics   Review of Systems Review of Systems  Constitutional: Negative for fever.  HENT: Positive for congestion, sinus pressure and sinus pain.   Respiratory: Positive for cough.    Physical Exam Triage Vital Signs ED Triage Vitals  Enc Vitals Group     BP 05/19/20 0939 (!) 158/59     Pulse Rate 05/19/20 0939 67     Resp 05/19/20 0939 18      Temp 05/19/20 0939 98.4 F (36.9 C)     Temp Source 05/19/20 0939 Oral     SpO2 05/19/20 0939 100 %     Weight 05/19/20 0935 195 lb (88.5 kg)     Height 05/19/20 0935 5\' 3"  (1.6 m)     Head Circumference --      Peak Flow --      Pain Score 05/19/20 0935 0     Pain Loc --      Pain Edu? --      Excl. in GC? --    Updated Vital Signs BP (!) 158/59 (BP Location: Right Arm)   Pulse 67   Temp 98.4 F (36.9 C) (Oral)   Resp 18   Ht 5\' 3"  (1.6 m)   Wt 88.5 kg  SpO2 100%   BMI 34.54 kg/m   Visual Acuity Right Eye Distance:   Left Eye Distance:   Bilateral Distance:    Right Eye Near:   Left Eye Near:    Bilateral Near:     Physical Exam Vitals and nursing note reviewed.  Constitutional:      General: She is not in acute distress.    Appearance: Normal appearance. She is not ill-appearing.  HENT:     Head: Normocephalic and atraumatic.     Nose:     Comments: Maxillary sinus tenderness to palpation.     Mouth/Throat:     Pharynx: Oropharynx is clear. No posterior oropharyngeal erythema.  Eyes:     General:        Right eye: No discharge.        Left eye: No discharge.     Conjunctiva/sclera: Conjunctivae normal.  Cardiovascular:     Rate and Rhythm: Normal rate and regular rhythm.     Heart sounds: No murmur.  Pulmonary:     Effort: Pulmonary effort is normal.     Breath sounds: Normal breath sounds. No wheezing or rales.  Neurological:     Mental Status: She is alert.  Psychiatric:        Mood and Affect: Mood normal.        Behavior: Behavior normal.    UC Treatments / Results  Labs (all labs ordered are listed, but only abnormal results are displayed) Labs Reviewed - No data to display  EKG   Radiology No results found.  Procedures Procedures (including critical care time)  Medications Ordered in UC Medications - No data to display  Initial Impression / Assessment and Plan / UC Course  I have reviewed the triage vital signs and the  nursing notes.  Pertinent labs & imaging results that were available during my care of the patient were reviewed by me and considered in my medical decision making (see chart for details).    74 year old female presents with sinusitis.  Treating with doxycycline.  Final Clinical Impressions(s) / UC Diagnoses   Final diagnoses:  Acute maxillary sinusitis, recurrence not specified   Discharge Instructions   None    ED Prescriptions    Medication Sig Dispense Auth. Provider   doxycycline (VIBRAMYCIN) 100 MG capsule Take 1 capsule (100 mg total) by mouth 2 (two) times daily. 14 capsule Thersa Salt G, DO     PDMP not reviewed this encounter.   Coral Spikes, DO 05/19/20 1018

## 2020-05-19 NOTE — ED Triage Notes (Signed)
Patient c/o nasal congestion and cough that started 1 week ago.

## 2020-09-02 ENCOUNTER — Other Ambulatory Visit (INDEPENDENT_AMBULATORY_CARE_PROVIDER_SITE_OTHER): Payer: Self-pay | Admitting: Vascular Surgery

## 2020-09-02 DIAGNOSIS — I6523 Occlusion and stenosis of bilateral carotid arteries: Secondary | ICD-10-CM

## 2020-09-06 ENCOUNTER — Ambulatory Visit (INDEPENDENT_AMBULATORY_CARE_PROVIDER_SITE_OTHER): Payer: Medicare HMO | Admitting: Vascular Surgery

## 2020-09-06 ENCOUNTER — Other Ambulatory Visit: Payer: Self-pay

## 2020-09-06 ENCOUNTER — Encounter (INDEPENDENT_AMBULATORY_CARE_PROVIDER_SITE_OTHER): Payer: Self-pay | Admitting: Vascular Surgery

## 2020-09-06 ENCOUNTER — Ambulatory Visit (INDEPENDENT_AMBULATORY_CARE_PROVIDER_SITE_OTHER): Payer: Medicare HMO

## 2020-09-06 VITALS — BP 129/78 | HR 78 | Resp 16 | Wt 197.0 lb

## 2020-09-06 DIAGNOSIS — I6523 Occlusion and stenosis of bilateral carotid arteries: Secondary | ICD-10-CM

## 2020-09-06 DIAGNOSIS — I10 Essential (primary) hypertension: Secondary | ICD-10-CM

## 2020-09-06 DIAGNOSIS — J449 Chronic obstructive pulmonary disease, unspecified: Secondary | ICD-10-CM

## 2020-09-06 DIAGNOSIS — E782 Mixed hyperlipidemia: Secondary | ICD-10-CM

## 2020-09-06 NOTE — Progress Notes (Signed)
MRN : [id number]  Becky Reese is a 74 y.o. ([birth year]) female who presents with chief complaint of  Chief Complaint  Patient presents with  . Follow-up    ultrasound follow up  .  History of Present Illness:   The patient is seen for follow up evaluation of carotid stenosis. The carotid stenosis followed by ultrasound.   The patient denies amaurosis fugax. There is no recent history of TIA symptoms or focal motor deficits. There is no prior documented CVA.  The patient is taking enteric-coated aspirin 81 mg daily.  There is no history of migraine headaches. There is no history of seizures.  The patient has a history of coronary artery disease, no recent episodes of angina or shortness of breath. The patient denies PAD or claudication symptoms. There is a history of hyperlipidemia which is being treated with a statin.   Carotid Duplex done today shows RICA=40-59% and LICA 40-59% (previous study RICA=40-59% and LICA =<40%)  Current Meds  Medication Sig  . albuterol (PROVENTIL) (2.5 MG/3ML) 0.083% nebulizer solution Take 2.5 mg by nebulization every 6 (six) hours as needed for wheezing or shortness of breath.  Marland Kitchen amLODipine (NORVASC) 5 MG tablet Take 5 mg by mouth daily.  Marland Kitchen aspirin 81 MG tablet Take 81 mg by mouth daily.  . carboxymethylcellulose (REFRESH PLUS) 0.5 % SOLN 1 drop 3 (three) times daily as needed.  . carvedilol (COREG) 6.25 MG tablet Take 6.25 mg by mouth 2 (two) times daily with a meal.  . Cholecalciferol (VITAMIN D) 125 MCG (5000 UT) CAPS Take by mouth.  . Coenzyme Q10 (COQ10) 50 MG CAPS Take by mouth.  . ezetimibe (ZETIA) 10 MG tablet Take 10 mg by mouth daily.  . ferrous sulfate 325 (65 FE) MG EC tablet Take 325 mg by mouth 3 (three) times daily with meals.  . fluticasone (FLONASE) 50 MCG/ACT nasal spray Place into both nostrils daily.  . Fluticasone-Salmeterol (ADVAIR) 250-50 MCG/DOSE AEPB Inhale 1 puff into the lungs 2 (two) times daily.  Marland Kitchen  losartan (COZAAR) 100 MG tablet Take 100 mg by mouth daily.  . Multiple Vitamin (MULTIVITAMIN) capsule Take 1 capsule by mouth daily.  . niacin 500 MG tablet Take 500 mg by mouth at bedtime.  Marland Kitchen omeprazole (PRILOSEC) 40 MG capsule Take by mouth.  . pravastatin (PRAVACHOL) 20 MG tablet Take 10 mg by mouth daily.   . [DISCONTINUED] losartan (COZAAR) 100 MG tablet Take 100 mg by mouth daily.    Past Medical History:  Diagnosis Date  . Asthma   . Chronic kidney disease   . Diabetes mellitus without complication (HCC)   . Hemorrhoids   . Hyperlipidemia   . Hypertension   . Murmur, cardiac   . Osteopenia     Past Surgical History:  Procedure Laterality Date  . ABDOMINAL HYSTERECTOMY     PARTIAL  . BREAST CYST EXCISION Right    NEG  . BREAST SURGERY    . COLONOSCOPY    . COLONOSCOPY WITH PROPOFOL N/A 01/29/2019   Procedure: COLONOSCOPY WITH PROPOFOL;  Surgeon: Scot Jun, MD;  Location: [facility] ENDOSCOPY;  Service: Endoscopy;  Laterality: N/A;  . ESOPHAGOGASTRODUODENOSCOPY (EGD) WITH PROPOFOL N/A 01/29/2019   Procedure: ESOPHAGOGASTRODUODENOSCOPY (EGD) WITH PROPOFOL;  Surgeon: Scot Jun, MD;  Location: [facility] ENDOSCOPY;  Service: Endoscopy;  Laterality: N/A;    Social History Social History   Tobacco Use  . Smoking status: Never Smoker  . Smokeless tobacco: Never Used  Vaping Use  .  Vaping Use: Never used  Substance Use Topics  . Alcohol use: No  . Drug use: No    Family History Family History  Problem Relation Age of Onset  . Cancer Mother   . Cancer Father   . Breast cancer Neg Hx     Allergies  Allergen Reactions  . Amlodipine   . Atorvastatin     Muscle pain   . Penicillins     Hives   . Rosuvastatin     Muscle Pain   . Simvastatin   . Sulfa Antibiotics Hives     REVIEW OF SYSTEMS (Negative unless checked)  Constitutional: [] Weight loss  [] Fever  [] Chills Cardiac: [] Chest pain   [] Chest pressure   [] Palpitations   [] Shortness of breath when  laying flat   [] Shortness of breath with exertion. Vascular:  [] Pain in legs with walking   [] Pain in legs at rest  [] History of DVT   [] Phlebitis   [] Swelling in legs   [] Varicose veins   [] Non-healing ulcers Pulmonary:   [] Uses home oxygen   [] Productive cough   [] Hemoptysis   [] Wheeze  [] COPD   [] Asthma Neurologic:  [] Dizziness   [] Seizures   [] History of stroke   [] History of TIA  [] Aphasia   [] Vissual changes   [] Weakness or numbness in arm   [] Weakness or numbness in leg Musculoskeletal:   [] Joint swelling   [] Joint pain   [] Low back pain Hematologic:  [] Easy bruising  [] Easy bleeding   [] Hypercoagulable state   [] Anemic Gastrointestinal:  [] Diarrhea   [] Vomiting  [] Gastroesophageal reflux/heartburn   [] Difficulty swallowing. Genitourinary:  [] Chronic kidney disease   [] Difficult urination  [] Frequent urination   [] Blood in urine Skin:  [] Rashes   [] Ulcers  Psychological:  [] History of anxiety   []  History of major depression.  Physical Examination  Vitals:   09/06/20 1135  BP: 129/78  Pulse: 78  Resp: 16  Weight: 197 lb (89.4 kg)   Body mass index is 34.9 kg/m. Gen: WD/WN, NAD Head: [place]/AT, No temporalis wasting.  Ear/Nose/Throat: Hearing grossly intact, nares w/o erythema or drainage Eyes: PER, EOMI, sclera nonicteric.  Neck: Supple, no large masses.   Pulmonary:  Good air movement, no audible wheezing bilaterally, no use of accessory muscles.  Cardiac: RRR, no JVD Vascular: bilateral carotid bruit Vessel Right Left  Radial Palpable Palpable  Carotid Palpable Palpable  Gastrointestinal: Non-distended. No guarding/no peritoneal signs.  Musculoskeletal: M/S 5/5 throughout.  No deformity or atrophy.  Neurologic: CN 2-12 intact. Symmetrical.  Speech is fluent. Motor exam as listed above. Psychiatric: Judgment intact, Mood & affect appropriate for pt's clinical situation. Dermatologic: No rashes or ulcers noted.  No changes consistent with cellulitis.   CBC No results found  for: WBC, HGB, HCT, MCV, PLT  BMET No results found for: NA, K, CL, CO2, GLUCOSE, BUN, CREATININE, CALCIUM, GFRNONAA, GFRAA CrCl cannot be calculated (No successful lab value found.).  COAG No results found for: INR, PROTIME  Radiology No results found.   Assessment/Plan 1. Carotid stenosis, asymptomatic, bilateral Recommend:  Given the patient's asymptomatic subcritical stenosis no further invasive testing or surgery at this time.  Duplex ultrasound shows RICA=40-59% and LICA 40-59% stenosis.  Continue antiplatelet therapy as prescribed Continue management of CAD, HTN and Hyperlipidemia Healthy heart diet,  encouraged exercise at least 4 times per week Follow up in 12 months with duplex ultrasound and physical exam    - VAS CAROTID; Future  2. Essential hypertension Continue antihypertensive medications as already ordered,  these medications have been reviewed and there are no changes at this time.   3. Mixed hyperlipidemia Continue statin as ordered and reviewed, no changes at this time   4. Chronic obstructive pulmonary disease, unspecified COPD type (HCC) Continue pulmonary medications and aerosols as already ordered, these medications have been reviewed and there are no changes at this time.     Levora Dredge, MD  09/06/2020 11:40 AM

## 2021-02-14 ENCOUNTER — Other Ambulatory Visit: Payer: Self-pay | Admitting: Family Medicine

## 2021-02-14 DIAGNOSIS — Z1231 Encounter for screening mammogram for malignant neoplasm of breast: Secondary | ICD-10-CM

## 2021-03-01 ENCOUNTER — Ambulatory Visit
Admission: RE | Admit: 2021-03-01 | Discharge: 2021-03-01 | Disposition: A | Discharge status: Home / Self Care | Payer: Medicare HMO | Source: Ambulatory Visit | Attending: Family Medicine | Admitting: Family Medicine

## 2021-03-01 ENCOUNTER — Other Ambulatory Visit: Payer: Self-pay

## 2021-03-01 DIAGNOSIS — Z1231 Encounter for screening mammogram for malignant neoplasm of breast: Secondary | ICD-10-CM | POA: Diagnosis not present

## 2021-05-20 ENCOUNTER — Encounter: Payer: Self-pay | Admitting: Emergency Medicine

## 2021-05-20 ENCOUNTER — Other Ambulatory Visit: Payer: Self-pay

## 2021-05-20 ENCOUNTER — Ambulatory Visit
Admission: EM | Admit: 2021-05-20 | Discharge: 2021-05-20 | Disposition: A | Discharge status: Home / Self Care | Payer: Medicare HMO | Attending: Sports Medicine | Admitting: Sports Medicine

## 2021-05-20 DIAGNOSIS — J069 Acute upper respiratory infection, unspecified: Secondary | ICD-10-CM | POA: Diagnosis not present

## 2021-05-20 DIAGNOSIS — J302 Other seasonal allergic rhinitis: Secondary | ICD-10-CM | POA: Diagnosis not present

## 2021-05-20 DIAGNOSIS — R0981 Nasal congestion: Secondary | ICD-10-CM

## 2021-05-20 DIAGNOSIS — R0982 Postnasal drip: Secondary | ICD-10-CM

## 2021-05-20 NOTE — ED Provider Notes (Signed)
[facility]    CSN: [id number] Arrival date & time: 05/20/21  1344      History   Chief Complaint Chief Complaint  Patient presents with  . Cough  . Nasal Congestion    HPI Becky Reese is a 75 y.o. female.   Patient is a pleasant 75 year old female who presents for evaluation of the above issue.  She normally sees [facility] for ongoing medical needs.  They were unable to see her today.  She is retired.  She reports URI symptoms for more than a week which include rhinorrhea, nasal congestion, cough, sneezing, and some chest congestion.  Symptoms began about a week ago.  She denies any fever shakes chills.  No nausea vomiting diarrhea.  No urinary or abdominal symptoms.  She does have a history of asthma and does take an inhaler, Advair.  She is a former smoker but stopped about 40 years ago.  No history of COPD.  She has been vaccinated against COVID and has received the booster.  She is also received her flu shot.  No COVID history of COVID exposure.  No chest pain or shortness of breath.  No red flag signs or symptoms elicited on history.     Past Medical History:  Diagnosis Date  . Asthma   . Chronic kidney disease   . Diabetes mellitus without complication (HCC)   . Hemorrhoids   . Hyperlipidemia   . Hypertension   . Murmur, cardiac   . Osteopenia     Patient Active Problem List   Diagnosis Date Noted  . Hypercalcemia 10/16/2019  . Gastric erosions, unspecified ulcer chronicity 06/10/2019  . Carotid stenosis, asymptomatic, bilateral 08/12/2017  . Eczema 08/12/2017  . 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 Adverse effect of statin 06/21/2017  . Carotid stenosis 04/23/2017  . COPD (chronic obstructive pulmonary disease) (HCC) 04/23/2017  . Essential hypertension 04/23/2017  . Hyperlipidemia 04/23/2017  . Chronic kidney disease, stage 3 unspecified (HCC) 03/08/2015  . Thyromegaly 03/08/2015  . Hemorrhoids 04/07/2014  . Asthma,  mild persistent 03/10/2014  . Cardiac murmur, unspecified 03/06/2014  . Prediabetes 06/10/2013  . Allergic rhinitis 05/09/2013  . Asthma 05/09/2013  . Atrophic vaginitis 05/09/2013  . Iritis 05/09/2013  . Obesity 05/09/2013  . Osteopenia 05/09/2013  . Uveitis 05/09/2013    Past Surgical History:  Procedure Laterality Date  . ABDOMINAL HYSTERECTOMY     PARTIAL  . BREAST CYST EXCISION Right    NEG  . BREAST SURGERY    . COLONOSCOPY    . COLONOSCOPY WITH PROPOFOL N/A 01/29/2019   Procedure: COLONOSCOPY WITH PROPOFOL;  Surgeon: Scot Jun, MD;  Location: [facility] ENDOSCOPY;  Service: Endoscopy;  Laterality: N/A;  . ESOPHAGOGASTRODUODENOSCOPY (EGD) WITH PROPOFOL N/A 01/29/2019   Procedure: ESOPHAGOGASTRODUODENOSCOPY (EGD) WITH PROPOFOL;  Surgeon: Scot Jun, MD;  Location: [facility] ENDOSCOPY;  Service: Endoscopy;  Laterality: N/A;    OB History   No obstetric history on file.      Home Medications    Prior to Admission medications   Medication Sig Start Date End Date Taking? Authorizing Provider  albuterol (PROVENTIL) (2.5 MG/3ML) 0.083% nebulizer solution Take 2.5 mg by nebulization every 6 (six) hours as needed for wheezing or shortness of breath.   Yes [provider]  aspirin 81 MG tablet Take 81 mg by mouth daily.   Yes [provider]  carvedilol (COREG) 6.25 MG tablet Take 6.25 mg by mouth 2 (two) times daily with a meal.  Yes [provider]  Cholecalciferol (VITAMIN D) 125 MCG (5000 UT) CAPS Take by mouth.   Yes [provider]  Coenzyme Q10 (COQ10) 50 MG CAPS Take by mouth.   Yes [provider]  ezetimibe (ZETIA) 10 MG tablet Take 10 mg by mouth daily.   Yes [provider]  ferrous sulfate 325 (65 FE) MG EC tablet Take 325 mg by mouth 3 (three) times daily with meals.   Yes [provider]  fluticasone (FLONASE) 50 MCG/ACT nasal spray Place into both nostrils daily.   Yes [provider]   Fluticasone-Salmeterol (ADVAIR) 250-50 MCG/DOSE AEPB Inhale 1 puff into the lungs 2 (two) times daily.   Yes [provider]  losartan (COZAAR) 100 MG tablet Take 100 mg by mouth daily.   Yes [provider]  Multiple Vitamin (MULTIVITAMIN) capsule Take 1 capsule by mouth daily.   Yes [provider]  niacin 500 MG tablet Take 500 mg by mouth at bedtime.   Yes [provider]  pravastatin (PRAVACHOL) 20 MG tablet Take 10 mg by mouth daily.    Yes [provider]  tetrahydrozoline 0.05 % ophthalmic solution    Yes [provider]  amLODipine (NORVASC) 5 MG tablet Take 5 mg by mouth daily. 12/23/19   [provider]  beclomethasone (QVAR) 80 MCG/ACT inhaler Inhale into the lungs 2 (two) times daily. Patient not taking: Reported on 09/06/2020    [provider]  carboxymethylcellulose (REFRESH PLUS) 0.5 % SOLN 1 drop 3 (three) times daily as needed.    [provider]  cetirizine (ZYRTEC) 10 MG tablet Take 10 mg by mouth daily. Patient not taking: Reported on 09/06/2020    [provider]  Coenzyme Q10 (UBIDECARENONE) POWD by Does not apply route. Patient not taking: Reported on 09/06/2020    [provider]  desoximetasone (TOPICORT) 0.25 % cream Apply 1 application topically 2 (two) times daily. Patient not taking: Reported on 09/06/2020    [provider]  doxycycline (VIBRAMYCIN) 100 MG capsule Take 1 capsule (100 mg total) by mouth 2 (two) times daily. Patient not taking: No sig reported 05/19/20   Tommie Sams, DO  losartan-hydrochlorothiazide (HYZAAR) 100-12.5 MG tablet Take 1 tablet by mouth daily. Patient not taking: No sig reported    [provider]  montelukast (SINGULAIR) 10 MG tablet Take 10 mg by mouth at bedtime. Patient not taking: No sig reported    [provider]  omeprazole (PRILOSEC) 40 MG capsule Take by mouth.    [provider]  simvastatin  (ZOCOR) 20 MG tablet Take 20 mg by mouth daily.  05/19/20  [provider]    Family History Family History  Problem Relation Age of Onset  . Cancer Mother   . Cancer Father   . Breast cancer Neg Hx     Social History Social History   Tobacco Use  . Smoking status: Never Smoker  . Smokeless tobacco: Never Used  Vaping Use  . Vaping Use: Never used  Substance Use Topics  . Alcohol use: No  . Drug use: No     Allergies   Amlodipine, Atorvastatin, Penicillins, Rosuvastatin, Simvastatin, and Sulfa antibiotics   Review of Systems Review of Systems  Constitutional: Negative for appetite change, chills, diaphoresis, fatigue and fever.  HENT: Positive for congestion, postnasal drip, rhinorrhea and sneezing. Negative for ear pain, sinus pressure, sinus pain and sore throat.   Eyes: Negative for pain.  Respiratory: Positive for  chest tightness. Negative for cough, shortness of breath and wheezing.   Cardiovascular: Negative for chest pain and palpitations.  Gastrointestinal: Negative for abdominal pain, diarrhea, nausea and vomiting.  Genitourinary: Negative for dysuria.  Musculoskeletal: Negative for back pain, myalgias and neck pain.  Skin: Negative for color change, pallor, rash and wound.  Neurological: Negative for dizziness, light-headedness and headaches.  All other systems reviewed and are negative.    Physical Exam Triage Vital Signs ED Triage Vitals  Enc Vitals Group     BP 05/20/21 1411 (!) 154/86     Pulse Rate 05/20/21 1411 71     Resp 05/20/21 1411 14     Temp 05/20/21 1411 98.1 F (36.7 C)     Temp Source 05/20/21 1411 Oral     SpO2 05/20/21 1411 97 %     Weight 05/20/21 1407 200 lb (90.7 kg)     Height 05/20/21 1407 5\' 3"  (1.6 m)     Head Circumference --      Peak Flow --      Pain Score 05/20/21 1407 0     Pain Loc --      Pain Edu? --      Excl. in GC? --    No data found.  Updated Vital Signs BP (!) 154/86 (BP Location: Left Arm)    Pulse 71   Temp 98.1 F (36.7 C) (Oral)   Resp 14   Ht 5\' 3"  (1.6 m)   Wt 90.7 kg   SpO2 97%   BMI 35.43 kg/m   Visual Acuity Right Eye Distance:   Left Eye Distance:   Bilateral Distance:    Right Eye Near:   Left Eye Near:    Bilateral Near:     Physical Exam Vitals and nursing note reviewed.  Constitutional:      General: She is not in acute distress.    Appearance: Normal appearance. She is not ill-appearing, toxic-appearing or diaphoretic.  HENT:     Head: Normocephalic and atraumatic.     Nose: Congestion and rhinorrhea present.     Mouth/Throat:     Mouth: Mucous membranes are moist.     Pharynx: No oropharyngeal exudate or posterior oropharyngeal erythema.  Eyes:     General: No scleral icterus.       Right eye: No discharge.        Left eye: No discharge.     Extraocular Movements: Extraocular movements intact.     Conjunctiva/sclera: Conjunctivae normal.     Pupils: Pupils are equal, round, and reactive to light.  Cardiovascular:     Rate and Rhythm: Normal rate and regular rhythm.     Pulses: Normal pulses.     Heart sounds: Normal heart sounds. No murmur heard. No friction rub. No gallop.   Pulmonary:     Effort: Pulmonary effort is normal.     Breath sounds: Normal breath sounds. No stridor. No wheezing, rhonchi or rales.  Musculoskeletal:     Cervical back: Normal range of motion and neck supple.  Skin:    General: Skin is warm and dry.     Capillary Refill: Capillary refill takes less than 2 seconds.     Coloration: Skin is not jaundiced.     Findings: No bruising, erythema, lesion or rash.  Neurological:     General: No focal deficit present.     Mental Status: She is alert and oriented to person, place, and time.  Psychiatric:  Mood and Affect: Mood normal.      UC Treatments / Results  Labs (all labs ordered are listed, but only abnormal results are displayed) Labs Reviewed - No data to display  EKG   Radiology No results  found.  Procedures Procedures (including critical care time)  Medications Ordered in UC Medications - No data to display  Initial Impression / Assessment and Plan / UC Course  I have reviewed the triage vital signs and the nursing notes.  Pertinent labs & imaging results that were available during my care of the patient were reviewed by me and considered in my medical decision making (see chart for details).  Clinical impression URI symptoms including nasal congestion and postnasal drip.  She has no fever.  Symptoms have been present for more than a week.  Consistent with a URI.  Complicating her situation as she has some seasonal allergies and asthma.  Treatment plan: 1.  The findings and treatment plan were discussed in detail with the patient.  Patient was in agreement. 2.  We discussed doing COVID and influenza testing but given the fact her symptoms have been going on for more than a week and she is afebrile and her exam is reassuring I did not feel there was much utility in doing that.  She was in agreement. 3.  I believe most of her symptoms are coming from her seasonal allergies.  She is 97% on room air without any wheezing on exam.  Clinically she does not require an antibiotic. 4.  I provided educational handouts. 5.  If symptoms persist have advised her to see her primary care physician. 6.  If her symptoms worsen then she should go to the ER. 7.  Continue with her asthma and seasonal allergy meds. 8.  She was discharged in stable condition and she will follow-up here as needed.    Final Clinical Impressions(s) / UC Diagnoses   Final diagnoses:  Viral upper respiratory tract infection  Nasal congestion  Seasonal allergies  Post-nasal drainage     Discharge Instructions     As we discussed, you have symptoms of a viral upper respiratory tract infection.  You have nasal congestion and postnasal drainage.  Complicating his situation is you do have seasonal  allergies. Given that has been going on for more than a week and your vital signs are stable I do not feel doing COVID or influenza testing would be beneficial. You are not wheezing and you were 97% on room air.  You do have your inhalers.  Clinically you do not have bronchitis so I am not going to add an antibiotic at this time. You may need an antibiotic in the future. Please see educational handouts. If symptoms persist please see your primary care provider. If they worsen then please go to the emergency room.    ED Prescriptions    None     PDMP not reviewed this encounter.   Delton See, MD 05/21/21 [place]

## 2021-05-20 NOTE — ED Triage Notes (Signed)
Patient c/o sneezing, runny nose, cough and congestion that started over a week ago.  Patient denies fevers.

## 2021-05-20 NOTE — Discharge Instructions (Addendum)
As we discussed, you have symptoms of a viral upper respiratory tract infection.  You have nasal congestion and postnasal drainage.  Complicating his situation is you do have seasonal allergies. Given that has been going on for more than a week and your vital signs are stable I do not feel doing COVID or influenza testing would be beneficial. You are not wheezing and you were 97% on room air.  You do have your inhalers.  Clinically you do not have bronchitis so I am not going to add an antibiotic at this time. You may need an antibiotic in the future. Please see educational handouts. If symptoms persist please see your primary care provider. If they worsen then please go to the emergency room.

## 2021-09-06 ENCOUNTER — Ambulatory Visit (INDEPENDENT_AMBULATORY_CARE_PROVIDER_SITE_OTHER): Payer: Medicare HMO | Admitting: Nurse Practitioner

## 2021-09-06 ENCOUNTER — Ambulatory Visit (INDEPENDENT_AMBULATORY_CARE_PROVIDER_SITE_OTHER): Payer: Medicare HMO

## 2021-09-06 ENCOUNTER — Other Ambulatory Visit: Payer: Self-pay

## 2021-09-06 ENCOUNTER — Encounter (INDEPENDENT_AMBULATORY_CARE_PROVIDER_SITE_OTHER): Payer: Self-pay | Admitting: Nurse Practitioner

## 2021-09-06 VITALS — BP 148/85 | HR 83 | Resp 16 | Wt 204.0 lb

## 2021-09-06 DIAGNOSIS — E782 Mixed hyperlipidemia: Secondary | ICD-10-CM

## 2021-09-06 DIAGNOSIS — I10 Essential (primary) hypertension: Secondary | ICD-10-CM

## 2021-09-06 DIAGNOSIS — I6523 Occlusion and stenosis of bilateral carotid arteries: Secondary | ICD-10-CM | POA: Diagnosis not present

## 2021-09-11 NOTE — Progress Notes (Signed)
Subjective:    Patient ID: Becky Reese, female    DOB: [birth year], 75 y.o.   MRN: [id number] Chief Complaint  Patient presents with   Follow-up    Ultrasound follow up    Dilia Alemany is a 75 year old female that is seen for follow up evaluation of carotid stenosis. The carotid stenosis followed by ultrasound.   The patient denies amaurosis fugax. There is no recent history of TIA symptoms or focal motor deficits. There is no prior documented CVA.  She denies any arm pain or dizziness.  The patient is taking enteric-coated aspirin 81 mg daily.  There is no history of migraine headaches. There is no history of seizures.  The patient has a history of coronary artery disease, no recent episodes of angina or shortness of breath. The patient denies PAD or claudication symptoms. There is a history of hyperlipidemia which is being treated with a statin.    Carotid Duplex done today shows 1 to 39% stenosis of the left ICA with 40 to 59% stenosis of the right ICA.  The left vertebral artery demonstrates retrograde flow and this was consistent with the previous ultrasound.  The left subclavian is noted to be stenotic different from previous studies.   Review of Systems  Neurological:  Negative for dizziness.  All other systems reviewed and are negative.     Objective:   Physical Exam Vitals reviewed.  HENT:     Head: Normocephalic.  Neck:     Vascular: Carotid bruit present.  Cardiovascular:     Rate and Rhythm: Normal rate.     Pulses: Normal pulses.  Pulmonary:     Effort: Pulmonary effort is normal.  Neurological:     Mental Status: She is alert and oriented to person, place, and time.  Psychiatric:        Mood and Affect: Mood normal.        Behavior: Behavior normal.        Thought Content: Thought content normal.        Judgment: Judgment normal.    BP (!) 148/85 (BP Location: Right Arm)   Pulse 83   Resp 16   Wt 204 lb (92.5 kg)   BMI 36.14 kg/m   Past  Medical History:  Diagnosis Date   Asthma    Chronic kidney disease    Diabetes mellitus without complication (HCC)    Hemorrhoids    Hyperlipidemia    Hypertension    Murmur, cardiac    Osteopenia     Social History   Socioeconomic History   Marital status: Married    Spouse name: Not on file   Number of children: Not on file   Years of education: Not on file   Highest education level: Not on file  Occupational History   Not on file  Tobacco Use   Smoking status: Never   Smokeless tobacco: Never  Vaping Use   Vaping Use: Never used  Substance and Sexual Activity   Alcohol use: No   Drug use: No   Sexual activity: Not on file  Other Topics Concern   Not on file  Social History Narrative   Not on file   Social Determinants of Health   Financial Resource Strain: Not on file  Food Insecurity: Not on file  Transportation Needs: Not on file  Physical Activity: Not on file  Stress: Not on file  Social Connections: Not on file  Intimate Partner Violence: Not on file  Past Surgical History:  Procedure Laterality Date   ABDOMINAL HYSTERECTOMY     PARTIAL   BREAST CYST EXCISION Right    NEG   BREAST SURGERY     COLONOSCOPY     COLONOSCOPY WITH PROPOFOL N/A 01/29/2019   Procedure: COLONOSCOPY WITH PROPOFOL;  Surgeon: Scot Jun, MD;  Location: [facility] ENDOSCOPY;  Service: Endoscopy;  Laterality: N/A;   ESOPHAGOGASTRODUODENOSCOPY (EGD) WITH PROPOFOL N/A 01/29/2019   Procedure: ESOPHAGOGASTRODUODENOSCOPY (EGD) WITH PROPOFOL;  Surgeon: Scot Jun, MD;  Location: [facility] ENDOSCOPY;  Service: Endoscopy;  Laterality: N/A;    Family History  Problem Relation Age of Onset   Cancer Mother    Cancer Father    Breast cancer Neg Hx     Allergies  Allergen Reactions   Atorvastatin     Muscle pain  Other reaction(s): Muscle Pain, Other (see comments) Muscle pain Muscle pain   Rosuvastatin     Muscle Pain  Other reaction(s): Muscle Pain, Other (see  comments) Muscle Pain Muscle Pain   Simvastatin     Other reaction(s): Muscle Pain, Other (see comments)   Amlodipine    Penicillins Hives    Hives  NDC Code: 144315400 Hives Hives   Sulfa Antibiotics Hives    NDC Code: 867619509    No flowsheet data found.    CMP  No results found for: NA, K, CL, CO2, GLUCOSE, BUN, CREATININE, CALCIUM, PROT, ALBUMIN, AST, ALT, ALKPHOS, BILITOT, GFRNONAA, GFRAA   No results found.     Assessment & Plan:   1. Carotid stenosis, asymptomatic, bilateral Recommend:  Given the patient's asymptomatic subcritical stenosis no further invasive testing or surgery at this time.  Duplex ultrasound shows 1 to 39% stenosis of the left ICA and 40 to 59% stenosis of the right ICA.  There is a stenotic left subclavian artery noted on studies today.  However the left vertebral artery also shows retrograde flow which was noticed on previous studies.  Continue antiplatelet therapy as prescribed Continue management of CAD, HTN and Hyperlipidemia Healthy heart diet,  encouraged exercise at least 4 times per week Follow up in 12 months with duplex ultrasound and physical exam    2. Essential hypertension Continue antihypertensive medications as already ordered, these medications have been reviewed and there are no changes at this time.   3. Mixed hyperlipidemia Continue statin as ordered and reviewed, no changes at this time    Current Outpatient Medications on File Prior to Visit  Medication Sig Dispense Refill   albuterol (PROVENTIL) (2.5 MG/3ML) 0.083% nebulizer solution Take 2.5 mg by nebulization every 6 (six) hours as needed for wheezing or shortness of breath.     amLODipine (NORVASC) 5 MG tablet Take 5 mg by mouth daily.     aspirin 81 MG tablet Take 81 mg by mouth daily.     carboxymethylcellulose (REFRESH PLUS) 0.5 % SOLN 1 drop 3 (three) times daily as needed.     carvedilol (COREG) 6.25 MG tablet Take 6.25 mg by mouth 2 (two) times daily  with a meal.     Cholecalciferol (VITAMIN D) 125 MCG (5000 UT) CAPS Take by mouth.     Coenzyme Q10 (COQ10) 50 MG CAPS Take by mouth.     ezetimibe (ZETIA) 10 MG tablet Take 10 mg by mouth daily.     ferrous sulfate 325 (65 FE) MG EC tablet Take 325 mg by mouth 3 (three) times daily with meals.     fluticasone (FLONASE) 50 MCG/ACT nasal spray Place  into both nostrils daily.     Fluticasone-Salmeterol (ADVAIR) 250-50 MCG/DOSE AEPB Inhale 1 puff into the lungs 2 (two) times daily.     losartan (COZAAR) 100 MG tablet Take 100 mg by mouth daily.     Multiple Vitamin (MULTIVITAMIN) capsule Take 1 capsule by mouth daily.     niacin 500 MG tablet Take 500 mg by mouth at bedtime.     pravastatin (PRAVACHOL) 20 MG tablet Take 10 mg by mouth daily.      tetrahydrozoline 0.05 % ophthalmic solution      beclomethasone (QVAR) 80 MCG/ACT inhaler Inhale into the lungs 2 (two) times daily. (Patient not taking: No sig reported)     cetirizine (ZYRTEC) 10 MG tablet Take 10 mg by mouth daily. (Patient not taking: No sig reported)     Coenzyme Q10 (UBIDECARENONE) POWD by Does not apply route. (Patient not taking: No sig reported)     desoximetasone (TOPICORT) 0.25 % cream Apply 1 application topically 2 (two) times daily. (Patient not taking: No sig reported)     doxycycline (VIBRAMYCIN) 100 MG capsule Take 1 capsule (100 mg total) by mouth 2 (two) times daily. (Patient not taking: No sig reported) 14 capsule 0   losartan-hydrochlorothiazide (HYZAAR) 100-12.5 MG tablet Take 1 tablet by mouth daily. (Patient not taking: No sig reported)     montelukast (SINGULAIR) 10 MG tablet Take 10 mg by mouth at bedtime. (Patient not taking: No sig reported)     omeprazole (PRILOSEC) 40 MG capsule Take by mouth. (Patient not taking: Reported on 09/06/2021)     [DISCONTINUED] simvastatin (ZOCOR) 20 MG tablet Take 20 mg by mouth daily.     No current facility-administered medications on file prior to visit.    There are no  Patient Instructions on file for this visit. No follow-ups on file.   Georgiana Spinner, NP

## 2022-03-01 ENCOUNTER — Other Ambulatory Visit: Payer: Self-pay | Admitting: Family Medicine

## 2022-03-01 DIAGNOSIS — Z1231 Encounter for screening mammogram for malignant neoplasm of breast: Secondary | ICD-10-CM

## 2022-03-16 ENCOUNTER — Other Ambulatory Visit: Payer: Self-pay

## 2022-03-16 ENCOUNTER — Ambulatory Visit
Admission: RE | Admit: 2022-03-16 | Discharge: 2022-03-16 | Disposition: A | Discharge status: Home / Self Care | Payer: Medicare HMO | Source: Ambulatory Visit | Attending: Family Medicine | Admitting: Family Medicine

## 2022-03-16 DIAGNOSIS — Z1231 Encounter for screening mammogram for malignant neoplasm of breast: Secondary | ICD-10-CM | POA: Diagnosis present

## 2022-04-13 ENCOUNTER — Other Ambulatory Visit: Payer: Self-pay | Admitting: Family Medicine

## 2022-04-13 DIAGNOSIS — Z780 Asymptomatic menopausal state: Secondary | ICD-10-CM

## 2022-05-25 ENCOUNTER — Ambulatory Visit
Admission: RE | Admit: 2022-05-25 | Discharge: 2022-05-25 | Disposition: A | Discharge status: Home / Self Care | Payer: Medicare HMO | Source: Ambulatory Visit | Attending: Family Medicine | Admitting: Family Medicine

## 2022-05-25 DIAGNOSIS — Z780 Asymptomatic menopausal state: Secondary | ICD-10-CM | POA: Diagnosis not present

## 2022-06-29 ENCOUNTER — Other Ambulatory Visit: Payer: Self-pay

## 2022-06-29 ENCOUNTER — Ambulatory Visit
Admission: EM | Admit: 2022-06-29 | Discharge: 2022-06-29 | Disposition: A | Discharge status: Home / Self Care | Payer: Medicare HMO | Attending: Emergency Medicine | Admitting: Emergency Medicine

## 2022-06-29 ENCOUNTER — Encounter: Payer: Self-pay | Admitting: Emergency Medicine

## 2022-06-29 DIAGNOSIS — R5383 Other fatigue: Secondary | ICD-10-CM | POA: Insufficient documentation

## 2022-06-29 LAB — T4, FREE: Free T4: 0.9 ng/dL (ref 0.61–1.12)

## 2022-06-29 LAB — TSH: TSH: 2.338 u[IU]/mL (ref 0.350–4.500)

## 2022-06-29 NOTE — Discharge Instructions (Addendum)
Your neurological exam was normal. I think your fatigue, nail brittleness, constipation., and intolerance of heat and cold is related to your Thyroid.  I will order a thyroid panel to look for abnormalities.  Schedule and eye exam.  Follow-up with your PCP and [facility].

## 2022-06-29 NOTE — ED Triage Notes (Signed)
Patient complains of feeling "woozy"patient has watery eyes.  Denies pain, cough or runny nose.    Patient took an extra blood pressure pill prior to leaving home.

## 2022-06-29 NOTE — ED Provider Notes (Signed)
[facility]    CSN: [id number] Arrival date & time: 06/29/22  1230      History   Chief Complaint Chief Complaint  Patient presents with   Dizziness    HPI Becky Reese is a 76 y.o. female.   HPI  77 year old female here for evaluation of feeling "woozy".  Patient reports that she has been feeling woozy for some time.  When asked to clarify that statement she states that she has a little bit of intermittent blurry vision as well as watering of her eyes.  She has been expressing poor sleep quality, fatigue for over 2 months, brittle nails, constipation, and intolerance of heat and cold.  She has contacted her PCP office for an appointment but they directed her to come to the urgent care.  She denies any headache, nausea or vomiting, numbness, tingling, weakness in any of her extremities, or chest pain.  She states that she has not had an eye exam in over a year and she thinks the vision issues is secondary to her glasses.  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  Past Medical History:  Diagnosis Date   Asthma    Chronic kidney disease    Diabetes mellitus without complication (HCC)    Hemorrhoids    Hyperlipidemia    Hypertension    Murmur, cardiac    Osteopenia     Patient Active Problem List   Diagnosis Date Noted   Hypercalcemia 10/16/2019   Gastric erosions, unspecified ulcer chronicity 06/10/2019   Carotid stenosis, asymptomatic, bilateral 08/12/2017   Eczema 08/12/2017   Subclavian artery stenosis (HCC) 08/12/2017   Adverse effect of statin 06/21/2017   Carotid stenosis 04/23/2017   COPD (chronic obstructive pulmonary disease) (HCC) 04/23/2017   Essential hypertension 04/23/2017   Hyperlipidemia 04/23/2017   Chronic kidney disease, stage 3 unspecified (HCC) 03/08/2015   Thyromegaly 03/08/2015   Hemorrhoids 04/07/2014   Asthma, mild  persistent 03/10/2014   Cardiac murmur, unspecified 03/06/2014   Prediabetes 06/10/2013   Allergic rhinitis 05/09/2013   Asthma 05/09/2013   Atrophic vaginitis 05/09/2013   Iritis 05/09/2013   Obesity 05/09/2013   Osteopenia 05/09/2013   Uveitis 05/09/2013    Past Surgical History:  Procedure Laterality Date   ABDOMINAL HYSTERECTOMY     PARTIAL   BREAST CYST EXCISION Right    NEG   COLONOSCOPY     COLONOSCOPY WITH PROPOFOL N/A 01/29/2019   Procedure: COLONOSCOPY WITH PROPOFOL;  Surgeon: Scot Jun, MD;  Location: [facility] ENDOSCOPY;  Service: Endoscopy;  Laterality: N/A;   ESOPHAGOGASTRODUODENOSCOPY (EGD) WITH PROPOFOL N/A 01/29/2019   Procedure: ESOPHAGOGASTRODUODENOSCOPY (EGD) WITH PROPOFOL;  Surgeon: Scot Jun, MD;  Location: [facility] ENDOSCOPY;  Service: Endoscopy;  Laterality: N/A;    OB History   No obstetric history on file.      Home Medications    Prior to Admission medications   Medication Sig Start Date End Date Taking? Authorizing Provider  albuterol (PROVENTIL) (2.5 MG/3ML) 0.083% nebulizer solution Take 2.5 mg by nebulization every 6 (six) hours as needed for wheezing or shortness of breath.    [provider]  amLODipine (NORVASC) 5 MG tablet Take 5 mg by mouth daily. 12/23/19   [provider]  aspirin 81 MG tablet Take 81 mg by mouth daily.    [provider]  beclomethasone (QVAR) 80 MCG/ACT inhaler Inhale into the  lungs 2 (two) times daily. Patient not taking: No sig reported    [provider]  carboxymethylcellulose (REFRESH PLUS) 0.5 % SOLN 1 drop 3 (three) times daily as needed.    [provider]  carvedilol (COREG) 6.25 MG tablet Take 6.25 mg by mouth 2 (two) times daily with a meal.    [provider]  cetirizine (ZYRTEC) 10 MG tablet Take 10 mg by mouth daily. Patient not taking: No sig reported    [provider]  Cholecalciferol (VITAMIN D) 125 MCG (5000 UT) CAPS Take by  mouth.    [provider]  Coenzyme Q10 (COQ10) 50 MG CAPS Take by mouth.    [provider]  Coenzyme Q10 (UBIDECARENONE) POWD by Does not apply route. Patient not taking: No sig reported    [provider]  desoximetasone (TOPICORT) 0.25 % cream Apply 1 application topically 2 (two) times daily. Patient not taking: No sig reported    [provider]  doxycycline (VIBRAMYCIN) 100 MG capsule Take 1 capsule (100 mg total) by mouth 2 (two) times daily. Patient not taking: No sig reported 05/19/20   Tommie Sams, DO  ezetimibe (ZETIA) 10 MG tablet Take 10 mg by mouth daily.    [provider]  ferrous sulfate 325 (65 FE) MG EC tablet Take 325 mg by mouth 3 (three) times daily with meals.    [provider]  fluticasone (FLONASE) 50 MCG/ACT nasal spray Place into both nostrils daily.    [provider]  Fluticasone-Salmeterol (ADVAIR) 250-50 MCG/DOSE AEPB Inhale 1 puff into the lungs 2 (two) times daily.    [provider]  losartan (COZAAR) 100 MG tablet Take 100 mg by mouth daily.    [provider]  losartan-hydrochlorothiazide (HYZAAR) 100-12.5 MG tablet Take 1 tablet by mouth daily. Patient not taking: No sig reported    [provider]  montelukast (SINGULAIR) 10 MG tablet Take 10 mg by mouth at bedtime. Patient not taking: No sig reported    [provider]  Multiple Vitamin (MULTIVITAMIN) capsule Take 1 capsule by mouth daily.    [provider]  niacin 500 MG tablet Take 500 mg by mouth at bedtime.    [provider]  omeprazole (PRILOSEC) 40 MG capsule Take by mouth. Patient not taking: Reported on 09/06/2021    [provider]  pravastatin (PRAVACHOL) 20 MG tablet Take 10 mg by mouth daily.     [provider]  tetrahydrozoline 0.05 % ophthalmic solution     [provider]  simvastatin (ZOCOR) 20 MG tablet Take 20 mg by mouth daily.  05/19/20   [provider]    Family History Family History  Problem Relation Age of Onset   Cancer Mother    Cancer Father    Breast cancer Neg Hx     Social History Social History   Tobacco Use   Smoking status: Never   Smokeless tobacco: Never  Vaping Use   Vaping Use: Never used  Substance Use Topics   Alcohol use: No   Drug use: No     Allergies   Atorvastatin, Rosuvastatin, Simvastatin, Amlodipine, Penicillins, and Sulfa antibiotics   Review of Systems Review of Systems  Constitutional:  Positive for fatigue.  Gastrointestinal:  Positive for constipation.  Endocrine: Positive for cold intolerance and heat intolerance.  Skin:        Brittle nails and dry skin  Neurological:  Negative for dizziness, facial asymmetry, weakness, numbness  and headaches.  Hematological: Negative.      Physical Exam Triage Vital Signs ED Triage Vitals  Enc Vitals Group     BP 06/29/22 1315 120/71     Pulse Rate 06/29/22 1315 68     Resp 06/29/22 1315 20     Temp 06/29/22 1315 98.9 F (37.2 C)     Temp Source 06/29/22 1315 Oral     SpO2 06/29/22 1315 100 %     Weight --      Height --      Head Circumference --      Peak Flow --      Pain Score 06/29/22 1312 0     Pain Loc --      Pain Edu? --      Excl. in [place]? --    No data found.  Updated Vital Signs BP 120/71 (BP Location: Left Arm) Comment (BP Location): large cuff  Pulse 68   Temp 98.9 F (37.2 C) (Oral)   Resp 20   SpO2 100%   Visual Acuity Right Eye Distance:   Left Eye Distance:   Bilateral Distance:    Right Eye Near:   Left Eye Near:    Bilateral Near:     Physical Exam Vitals and nursing note reviewed.  Constitutional:      Appearance: Normal appearance. She is not ill-appearing.  HENT:     Head: Normocephalic and atraumatic.     Mouth/Throat:     Mouth: Mucous membranes are moist.     Pharynx: Oropharynx is clear. No oropharyngeal exudate or posterior oropharyngeal erythema.  Eyes:      General: No scleral icterus.       Right eye: No discharge.        Left eye: No discharge.     Extraocular Movements: Extraocular movements intact.     Conjunctiva/sclera: Conjunctivae normal.     Pupils: Pupils are equal, round, and reactive to light.  Cardiovascular:     Rate and Rhythm: Normal rate and regular rhythm.     Pulses: Normal pulses.     Heart sounds: Normal heart sounds. No murmur heard.    No friction rub. No gallop.  Pulmonary:     Effort: Pulmonary effort is normal.     Breath sounds: Normal breath sounds. No wheezing, rhonchi or rales.  Musculoskeletal:     Cervical back: Normal range of motion and neck supple.  Lymphadenopathy:     Cervical: No cervical adenopathy.  Skin:    General: Skin is warm and dry.     Capillary Refill: Capillary refill takes less than 2 seconds.     Findings: No erythema or rash.  Neurological:     General: No focal deficit present.     Mental Status: She is alert and oriented to person, place, and time.     Sensory: No sensory deficit.     Motor: No weakness.  Psychiatric:        Mood and Affect: Mood normal.        Behavior: Behavior normal.        Thought Content: Thought content normal.        Judgment: Judgment normal.      UC Treatments / Results  Labs (all labs ordered are listed, but only abnormal results are displayed) Labs Reviewed  TSH  T3, FREE  T4, FREE    EKG   Radiology No results found.  Procedures Procedures (including critical care time)  Medications Ordered  in UC Medications - No data to display  Initial Impression / Assessment and Plan / UC Course  I have reviewed the triage vital signs and the nursing notes.  Pertinent labs & imaging results that were available during my care of the patient were reviewed by me and considered in my medical decision making (see chart for details).  Patient is a very pleasant, nontoxic-appearing 76 year old female here for evaluation of "wooziness".  As  mentioned in HPI above.  The where the patient describes as feeling is that she has some intermittent blurry vision as well as intermittent watery discharge from her eyes.  She states that her last eye exam was a year ago.  She denies any headache, room spinning, dizziness, nausea, vomiting, numbness, tingling, weakness in any of her extremities, or chest pain.  She is concerned because she has been experiencing fatigue for the past 2 months along with poor sleep quality, brittle nails, heat and cold intolerance, and constipation.  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  Patient's physical exam reveals cranial nerves II through XII intact.  Bilateral grips and upper extremity strength are 5 out of 5 and lower EXTR strength is 5 out of 5.  DTRs are 2+ globally.  Cardiopulmonary exam reveals S1-S2 heart sounds with regular rate and rhythm and lung sounds are clear to auscultation in all fields.  Patient is neurologically intact.  I suspect that her symptoms are secondary to thyroid involvement.  I will order a thyroid panel and have her follow-up with her PCP.   Final Clinical Impressions(s) / UC Diagnoses   Final diagnoses:  Other fatigue     Discharge Instructions      Your neurological exam was normal. I think your fatigue, nail brittleness, constipation., and intolerance of heat and cold is related to your Thyroid.  I will order a thyroid panel to look for abnormalities.  Schedule and eye exam.  Follow-up with your PCP and [facility].      ED Prescriptions   None    PDMP not reviewed this encounter.   Margarette Canada, NP 06/29/22 1404

## 2022-06-30 LAB — T3, FREE: T3, Free: 2.6 pg/mL (ref 2.0–4.4)

## 2022-09-07 ENCOUNTER — Encounter (INDEPENDENT_AMBULATORY_CARE_PROVIDER_SITE_OTHER): Payer: Medicare HMO

## 2022-09-07 ENCOUNTER — Ambulatory Visit (INDEPENDENT_AMBULATORY_CARE_PROVIDER_SITE_OTHER): Payer: Medicare HMO | Admitting: Vascular Surgery

## 2022-09-26 ENCOUNTER — Other Ambulatory Visit (INDEPENDENT_AMBULATORY_CARE_PROVIDER_SITE_OTHER): Payer: Self-pay | Admitting: Nurse Practitioner

## 2022-09-26 DIAGNOSIS — I6523 Occlusion and stenosis of bilateral carotid arteries: Secondary | ICD-10-CM

## 2022-09-28 ENCOUNTER — Ambulatory Visit (INDEPENDENT_AMBULATORY_CARE_PROVIDER_SITE_OTHER): Payer: Medicare HMO | Admitting: Vascular Surgery

## 2022-09-28 ENCOUNTER — Ambulatory Visit (INDEPENDENT_AMBULATORY_CARE_PROVIDER_SITE_OTHER): Payer: Medicare HMO

## 2022-09-28 DIAGNOSIS — I6523 Occlusion and stenosis of bilateral carotid arteries: Secondary | ICD-10-CM

## 2023-01-29 ENCOUNTER — Encounter: Payer: Self-pay | Admitting: Family Medicine

## 2023-01-29 DIAGNOSIS — Z1231 Encounter for screening mammogram for malignant neoplasm of breast: Secondary | ICD-10-CM

## 2023-02-06 ENCOUNTER — Other Ambulatory Visit: Payer: Self-pay

## 2023-02-06 DIAGNOSIS — Z1231 Encounter for screening mammogram for malignant neoplasm of breast: Secondary | ICD-10-CM

## 2023-03-20 ENCOUNTER — Ambulatory Visit
Admission: RE | Admit: 2023-03-20 | Discharge: 2023-03-20 | Disposition: A | Discharge status: Home / Self Care | Payer: Medicare HMO | Source: Ambulatory Visit | Attending: Family Medicine | Admitting: Family Medicine

## 2023-03-20 DIAGNOSIS — Z1231 Encounter for screening mammogram for malignant neoplasm of breast: Secondary | ICD-10-CM

## 2023-09-21 ENCOUNTER — Other Ambulatory Visit (INDEPENDENT_AMBULATORY_CARE_PROVIDER_SITE_OTHER): Payer: Self-pay | Admitting: Nurse Practitioner

## 2023-09-21 DIAGNOSIS — I6523 Occlusion and stenosis of bilateral carotid arteries: Secondary | ICD-10-CM

## 2023-09-27 ENCOUNTER — Ambulatory Visit (INDEPENDENT_AMBULATORY_CARE_PROVIDER_SITE_OTHER): Payer: Medicare HMO

## 2023-09-27 ENCOUNTER — Ambulatory Visit (INDEPENDENT_AMBULATORY_CARE_PROVIDER_SITE_OTHER): Payer: Medicare HMO | Admitting: Vascular Surgery

## 2023-09-27 DIAGNOSIS — I6523 Occlusion and stenosis of bilateral carotid arteries: Secondary | ICD-10-CM | POA: Diagnosis not present

## 2023-11-10 ENCOUNTER — Ambulatory Visit: Payer: Medicare HMO

## 2023-11-10 ENCOUNTER — Ambulatory Visit
Admission: EM | Admit: 2023-11-10 | Discharge: 2023-11-10 | Disposition: A | Discharge status: Home / Self Care | Payer: Medicare HMO | Attending: Family Medicine | Admitting: Family Medicine

## 2023-11-10 DIAGNOSIS — S93511A Sprain of interphalangeal joint of right great toe, initial encounter: Secondary | ICD-10-CM

## 2023-11-10 NOTE — ED Provider Notes (Signed)
[facility]    CSN: [id number] Arrival date & time: 11/10/23  7829      History   Chief Complaint Chief Complaint  Patient presents with   Toe Injury    HPI Becky Reese is a 77 y.o. female.   HPI  77 year old female with a past medical history significant for osteopenia, heart murmur, hypertension, hyperlipidemia, diabetes, chronic kidney disease, and asthma presents for evaluation of pain and swelling to her right great toe after tripping over a rug between 1 to 2 weeks ago.  She states that her toe folded under and initially she was not having much pain.  However, in the ensuing weeks she has developed swelling, bruising, and increased pain with limited range of motion.  No numbness or tingling.  Past Medical History:  Diagnosis Date   Asthma    Chronic kidney disease    Diabetes mellitus without complication (HCC)    Hemorrhoids    Hyperlipidemia    Hypertension    Murmur, cardiac    Osteopenia     Patient Active Problem List   Diagnosis Date Noted   Hypercalcemia 10/16/2019   Gastric erosions, unspecified ulcer chronicity 06/10/2019   Carotid stenosis, asymptomatic, bilateral 08/12/2017   Eczema 08/12/2017   Subclavian artery stenosis (HCC) 08/12/2017   Adverse effect of statin 06/21/2017   Carotid stenosis 04/23/2017   COPD (chronic obstructive pulmonary disease) (HCC) 04/23/2017   Essential hypertension 04/23/2017   Hyperlipidemia 04/23/2017   Chronic kidney disease, stage 3 unspecified (HCC) 03/08/2015   Thyromegaly 03/08/2015   Hemorrhoids 04/07/2014   Asthma, mild persistent 03/10/2014   Cardiac murmur, unspecified 03/06/2014   Prediabetes 06/10/2013   Allergic rhinitis 05/09/2013   Asthma 05/09/2013   Atrophic vaginitis 05/09/2013   Iritis 05/09/2013   Obesity 05/09/2013   Osteopenia 05/09/2013   Uveitis 05/09/2013    Past Surgical History:  Procedure Laterality Date   ABDOMINAL HYSTERECTOMY     PARTIAL   BREAST CYST  EXCISION Right    NEG   COLONOSCOPY     COLONOSCOPY WITH PROPOFOL N/A 01/29/2019   Procedure: COLONOSCOPY WITH PROPOFOL;  Surgeon: Scot Jun, MD;  Location: [facility] ENDOSCOPY;  Service: Endoscopy;  Laterality: N/A;   ESOPHAGOGASTRODUODENOSCOPY (EGD) WITH PROPOFOL N/A 01/29/2019   Procedure: ESOPHAGOGASTRODUODENOSCOPY (EGD) WITH PROPOFOL;  Surgeon: Scot Jun, MD;  Location: [facility] ENDOSCOPY;  Service: Endoscopy;  Laterality: N/A;    OB History   No obstetric history on file.      Home Medications    Prior to Admission medications   Medication Sig Start Date End Date Taking? Authorizing Provider  amLODipine (NORVASC) 5 MG tablet Take 5 mg by mouth daily. 12/23/19  Yes [provider]  aspirin 81 MG tablet Take 81 mg by mouth daily.   Yes [provider]  carvedilol (COREG) 6.25 MG tablet Take 6.25 mg by mouth 2 (two) times daily with a meal.   Yes [provider]  ezetimibe (ZETIA) 10 MG tablet Take 10 mg by mouth daily.   Yes [provider]  ferrous sulfate 325 (65 FE) MG EC tablet Take 325 mg by mouth 3 (three) times daily with meals.   Yes [provider]  fluticasone (FLONASE) 50 MCG/ACT nasal spray Place into both nostrils daily.   Yes [provider]  Fluticasone-Salmeterol (ADVAIR) 250-50 MCG/DOSE AEPB Inhale 1 puff into the lungs 2 (two) times daily.   Yes [provider]  losartan (COZAAR) 100 MG tablet Take 100 mg  by mouth daily.   Yes [provider]  Multiple Vitamin (MULTIVITAMIN) capsule Take 1 capsule by mouth daily.   Yes [provider]  niacin 500 MG tablet Take 500 mg by mouth at bedtime.   Yes [provider]  pravastatin (PRAVACHOL) 20 MG tablet Take 10 mg by mouth daily.    Yes [provider]  tetrahydrozoline 0.05 % ophthalmic solution    Yes [provider]  albuterol (PROVENTIL) (2.5 MG/3ML) 0.083% nebulizer solution Take 2.5 mg by nebulization  every 6 (six) hours as needed for wheezing or shortness of breath.    [provider]  beclomethasone (QVAR) 80 MCG/ACT inhaler Inhale into the lungs 2 (two) times daily. Patient not taking: No sig reported    [provider]  carboxymethylcellulose (REFRESH PLUS) 0.5 % SOLN 1 drop 3 (three) times daily as needed.    [provider]  cetirizine (ZYRTEC) 10 MG tablet Take 10 mg by mouth daily. Patient not taking: No sig reported    [provider]  Cholecalciferol (VITAMIN D) 125 MCG (5000 UT) CAPS Take by mouth.    [provider]  Coenzyme Q10 (COQ10) 50 MG CAPS Take by mouth.    [provider]  Coenzyme Q10 (UBIDECARENONE) POWD by Does not apply route. Patient not taking: No sig reported    [provider]  desoximetasone (TOPICORT) 0.25 % cream Apply 1 application topically 2 (two) times daily. Patient not taking: No sig reported    [provider]  doxycycline (VIBRAMYCIN) 100 MG capsule Take 1 capsule (100 mg total) by mouth 2 (two) times daily. Patient not taking: No sig reported 05/19/20   Tommie Sams, DO  losartan-hydrochlorothiazide (HYZAAR) 100-12.5 MG tablet Take 1 tablet by mouth daily. Patient not taking: No sig reported    [provider]  montelukast (SINGULAIR) 10 MG tablet Take 10 mg by mouth at bedtime. Patient not taking: No sig reported    [provider]  omeprazole (PRILOSEC) 40 MG capsule Take by mouth. Patient not taking: Reported on 09/06/2021    [provider]  simvastatin (ZOCOR) 20 MG tablet Take 20 mg by mouth daily.  05/19/20  [provider]    Family History Family History  Problem Relation Age of Onset   Cancer Mother    Cancer Father    Breast cancer Neg Hx     Social History Social History   Tobacco Use   Smoking status: Never   Smokeless tobacco: Never  Vaping Use   Vaping status: Never Used  Substance Use Topics   Alcohol use: No    Drug use: No     Allergies   Atorvastatin, Rosuvastatin, Simvastatin, Amlodipine, Penicillins, and Sulfa antibiotics   Review of Systems Review of Systems  Musculoskeletal:  Positive for arthralgias and joint swelling.  Skin:  Positive for color change.  Neurological:  Negative for numbness.     Physical Exam Triage Vital Signs ED Triage Vitals [11/10/23 0832]  Encounter Vitals Group     BP      Systolic BP Percentile      Diastolic BP Percentile      Pulse      Resp      Temp      Temp src      SpO2      Weight      Height      Head Circumference      Peak Flow  Pain Score 8     Pain Loc      Pain Education      Exclude from Growth Chart    No data found.  Updated Vital Signs BP (!) 148/60   Pulse 72   Temp 98.7 F (37.1 C) (Oral)   Resp 19   SpO2 99%   Visual Acuity Right Eye Distance:   Left Eye Distance:   Bilateral Distance:    Right Eye Near:   Left Eye Near:    Bilateral Near:     Physical Exam Vitals and nursing note reviewed.  Constitutional:      Appearance: Normal appearance. She is not ill-appearing.  HENT:     Head: Normocephalic and atraumatic.  Musculoskeletal:        General: Swelling, tenderness and signs of injury present. No deformity.  Skin:    General: Skin is warm and dry.     Capillary Refill: Capillary refill takes less than 2 seconds.     Findings: Bruising present.  Neurological:     General: No focal deficit present.     Mental Status: She is alert and oriented to person, place, and time.      UC Treatments / Results  Labs (all labs ordered are listed, but only abnormal results are displayed) Labs Reviewed - No data to display  EKG   Radiology DG Toe Great Right  Result Date: 11/10/2023 CLINICAL DATA:  Pain and swelling in the great toe after tripping over rug 1-2 weeks ago. EXAM: RIGHT GREAT TOE three views COMPARISON:  None Available. FINDINGS: Soft tissue swelling is present at the MTP joint and  IP joints. No underlying fracture is present. Mild degenerative changes are present at facet joints. No foreign body is present. IMPRESSION: Soft tissue swelling without underlying fracture or foreign body. Electronically Signed   By: Marin Roberts M.D.   On: 11/10/2023 09:29    Procedures Procedures (including critical care time)  Medications Ordered in UC Medications - No data to display  Initial Impression / Assessment and Plan / UC Course  I have reviewed the triage vital signs and the nursing notes.  Pertinent labs & imaging results that were available during my care of the patient were reviewed by me and considered in my medical decision making (see chart for details).   Patient is a pleasant, nontoxic-appearing 77 year old female presenting for evaluation of pain and swelling to her right great toe x 1 to 2 weeks.  As you can see in image above, the great toe is edematous and darker in color.  Patient reports that this is not her normal pigment.  The toe was tender at the IP joint and at the proximal phalanx but not at the MTP joint or distal phalanx.  Patient does have limited flexion extension of her toes secondary to swelling and pain.  DP and PT pulses are 2+.  I will obtain a radiograph of her great toe to evaluate for any bony abnormality.  Right great toe films independently reviewed and evaluated by me.  Impression: No evidence of fracture or dislocation.  There is soft tissue swelling visible on the x-ray.  Radiology overread is pending. Radiology impression states soft tissue swelling without underlying fracture or foreign body.  I will discharge patient home with a diagnosis of right great toe sprain and have her treated conservatively with rest, ice, elevation, and over-the-counter Tylenol and/or NSAIDs as needed.  If her pain continues I would give her the contact  information for [facility] in [place] and she can follow-up as needed.  Final Clinical  Impressions(s) / UC Diagnoses   Final diagnoses:  Sprain of interphalangeal joint of right great toe, initial encounter     Discharge Instructions      Your x-rays did not demonstrate any evidence of broken bones though it did show the soft tissue swelling.  I believe you have sprained your toe.  Please use over-the-counter Tylenol and/or ibuprofen according the package instructions as needed for pain and inflammation.  Follow the rehabilitation exercises given in your discharge packet to help facilitate healing.  Keep your right foot elevated is much as possible to help decrease swelling and aid in pain relief.  You may apply ice to your toe, or moist heat, to help decrease swelling and aid in pain relief and healing.  If your pain continues, or worsens, I recommend that you follow-up with a foot specialist and I have given you the contact information for [facility].  You may contact them for an appointment as needed.     ED Prescriptions   None    PDMP not reviewed this encounter.   Becky [place]a, NP 11/10/23 [phone]

## 2023-11-10 NOTE — ED Triage Notes (Addendum)
Patient tripped on rug and hurt her right great toe x 1 week ago. Patient states that her toe bent back.

## 2023-11-10 NOTE — Discharge Instructions (Addendum)
Your x-rays did not demonstrate any evidence of broken bones though it did show the soft tissue swelling.  I believe you have sprained your toe.  Please use over-the-counter Tylenol and/or ibuprofen according the package instructions as needed for pain and inflammation.  Follow the rehabilitation exercises given in your discharge packet to help facilitate healing.  Keep your right foot elevated is much as possible to help decrease swelling and aid in pain relief.  You may apply ice to your toe, or moist heat, to help decrease swelling and aid in pain relief and healing.  If your pain continues, or worsens, I recommend that you follow-up with a foot specialist and I have given you the contact information for [facility].  You may contact them for an appointment as needed.

## 2024-01-29 ENCOUNTER — Other Ambulatory Visit: Payer: Self-pay | Admitting: Family Medicine

## 2024-01-29 DIAGNOSIS — Z1231 Encounter for screening mammogram for malignant neoplasm of breast: Secondary | ICD-10-CM

## 2024-03-24 ENCOUNTER — Ambulatory Visit
Admission: RE | Admit: 2024-03-24 | Discharge: 2024-03-24 | Disposition: A | Discharge status: Home / Self Care | Payer: Medicare HMO | Source: Ambulatory Visit | Attending: Family Medicine | Admitting: Family Medicine

## 2024-03-24 DIAGNOSIS — Z1231 Encounter for screening mammogram for malignant neoplasm of breast: Secondary | ICD-10-CM | POA: Diagnosis present

## 2024-05-08 ENCOUNTER — Encounter: Payer: Self-pay | Admitting: Family Medicine

## 2024-05-08 ENCOUNTER — Ambulatory Visit (INDEPENDENT_AMBULATORY_CARE_PROVIDER_SITE_OTHER): Admitting: Family Medicine

## 2024-05-08 VITALS — BP 200/84 | HR 67 | Ht 63.0 in | Wt 198.2 lb

## 2024-05-08 DIAGNOSIS — I10 Essential (primary) hypertension: Secondary | ICD-10-CM | POA: Diagnosis not present

## 2024-05-08 DIAGNOSIS — J449 Chronic obstructive pulmonary disease, unspecified: Secondary | ICD-10-CM

## 2024-05-08 DIAGNOSIS — R7303 Prediabetes: Secondary | ICD-10-CM

## 2024-05-08 DIAGNOSIS — N1832 Chronic kidney disease, stage 3b: Secondary | ICD-10-CM | POA: Diagnosis not present

## 2024-05-08 DIAGNOSIS — E782 Mixed hyperlipidemia: Secondary | ICD-10-CM

## 2024-05-08 MED ORDER — CARVEDILOL 12.5 MG PO TABS
12.5000 mg | ORAL_TABLET | Freq: Two times a day (BID) | ORAL | 3 refills | Status: DC
Start: 2024-05-08 — End: 2024-08-28

## 2024-05-08 MED ORDER — AMLODIPINE BESYLATE 5 MG PO TABS: 5.0000 mg | ORAL_TABLET | Freq: Every day | ORAL | 0 refills | Status: AC

## 2024-05-08 NOTE — Assessment & Plan Note (Signed)
 Elevated.  Takes Amlodipine 2.5 mg every day , coreg 6.25 mg BID, Losartan 100 mg every day. She does not check BP. She has monitor at home. She is asymptomatic.   Increasing Coreg to 12.5 mg BID, amlodipine 5 MG every day , CONTINUE SAME DOSE : Losartan 100 mg every day , will consider adding hygroton based on her home BP readings.

## 2024-05-08 NOTE — Progress Notes (Signed)
 New Patient Office Visit  Subjective    Patient ID: Becky Reese, female    DOB: 10-07-1946  Age: 78 y.o. MRN: [id number]  CC:  Chief Complaint  Patient presents with   Establish Care    Patient presents today to establish care. She would like to discuss her HTN.    HPI Becky Reese presents to establish care 78 year old female with PMH of CKD 3b, HTN, Prediabetes, Subclavian artery stenosis, carotid stenosis, COPD, obesity, presents to th elcinic to establish care and wants to discuss about elevated BP. States visiting nurse checked her BP last week and it was high.  She is switching PCP for change. She does like her previous provider.  She follows nephrology and Vascular.  HTN:  Elevated.  Takes Amlodipine 2.5 mg every day , coreg 6.25 mg BID, Losartan 100 mg every day. She does not check BP. She has monitor at home. She is asymptomatic. Denies missing doses.  States her previous PCP took her off amlodipine as she was hypotensive. Per pt her left arm BP readings are false due to her above stenosis. So she always gets her BP checked from right arm.  CKD:  Follows Nephrology every 3 months. Last visit March 2025.   HLD: On statin   COPD:  Stable On Breo          Outpatient Encounter Medications as of 05/08/2024  Medication Sig   albuterol  (PROVENTIL ) (2.5 MG/3ML) 0.083% nebulizer solution Take 2.5 mg by nebulization every 6 (six) hours as needed for wheezing or shortness of breath.   amLODipine (NORVASC) 5 MG tablet Take 1 tablet (5 mg total) by mouth daily.   aspirin 81 MG tablet Take 81 mg by mouth daily.   carboxymethylcellulose (REFRESH PLUS) 0.5 % SOLN 1 drop 3 (three) times daily as needed.   carvedilol (COREG) 12.5 MG tablet Take 1 tablet (12.5 mg total) by mouth 2 (two) times daily with a meal.   cetirizine  (ZYRTEC ) 10 MG tablet Take 10 mg by mouth daily.   Cholecalciferol (VITAMIN D) 125 MCG (5000 UT) CAPS Take by mouth.   Coenzyme Q10  (COQ10) 50 MG CAPS Take by mouth.   desoximetasone (TOPICORT) 0.25 % cream Apply 1 application  topically 2 (two) times daily.   ezetimibe (ZETIA) 10 MG tablet Take 10 mg by mouth daily.   ferrous sulfate 325 (65 FE) MG EC tablet Take 325 mg by mouth 3 (three) times daily with meals.   fluticasone (FLONASE) 50 MCG/ACT nasal spray Place into both nostrils daily.   losartan (COZAAR) 100 MG tablet Take 100 mg by mouth daily.   Multiple Vitamin (MULTIVITAMIN) capsule Take 1 capsule by mouth daily.   niacin 500 MG tablet Take 500 mg by mouth at bedtime.   pravastatin (PRAVACHOL) 20 MG tablet Take 10 mg by mouth daily.    tetrahydrozoline 0.05 % ophthalmic solution    [DISCONTINUED] amLODipine (NORVASC) 5 MG tablet Take 5 mg by mouth daily.   [DISCONTINUED] carvedilol (COREG) 6.25 MG tablet Take 6.25 mg by mouth 2 (two) times daily with a meal.   [DISCONTINUED] beclomethasone (QVAR) 80 MCG/ACT inhaler Inhale into the lungs 2 (two) times daily. (Patient not taking: Reported on 09/06/2020)   [DISCONTINUED] Coenzyme Q10 (UBIDECARENONE) POWD by Does not apply route. (Patient not taking: No sig reported)   [DISCONTINUED] doxycycline  (VIBRAMYCIN ) 100 MG capsule Take 1 capsule (100 mg total) by mouth 2 (two) times daily. (Patient not taking: Reported on 09/06/2020)   [  DISCONTINUED] Fluticasone-Salmeterol (ADVAIR) 250-50 MCG/DOSE AEPB Inhale 1 puff into the lungs 2 (two) times daily. (Patient not taking: Reported on 05/08/2024)   [DISCONTINUED] losartan-hydrochlorothiazide (HYZAAR) 100-12.5 MG tablet Take 1 tablet by mouth daily. (Patient not taking: No sig reported)   [DISCONTINUED] montelukast (SINGULAIR) 10 MG tablet Take 10 mg by mouth at bedtime. (Patient not taking: Reported on 09/06/2020)   [DISCONTINUED] omeprazole (PRILOSEC) 40 MG capsule Take by mouth. (Patient not taking: Reported on 09/06/2021)   [DISCONTINUED] simvastatin (ZOCOR) 20 MG tablet Take 20 mg by mouth daily.   No facility-administered  encounter medications on file as of 05/08/2024.    Past Medical History:  Diagnosis Date   Asthma    Chronic kidney disease    Diabetes mellitus without complication (HCC)    Hemorrhoids    Hyperlipidemia    Hypertension    Murmur, cardiac    Osteopenia     Past Surgical History:  Procedure Laterality Date   ABDOMINAL HYSTERECTOMY     PARTIAL   BREAST CYST EXCISION Right    NEG   COLONOSCOPY     COLONOSCOPY WITH PROPOFOL  N/A 01/29/2019   Procedure: COLONOSCOPY WITH PROPOFOL ;  Surgeon: Cassie Click, MD;  Location: [facility] ENDOSCOPY;  Service: Endoscopy;  Laterality: N/A;   ESOPHAGOGASTRODUODENOSCOPY (EGD) WITH PROPOFOL  N/A 01/29/2019   Procedure: ESOPHAGOGASTRODUODENOSCOPY (EGD) WITH PROPOFOL ;  Surgeon: Cassie Click, MD;  Location: [facility] ENDOSCOPY;  Service: Endoscopy;  Laterality: N/A;    Family History  Problem Relation Age of Onset   Cancer Mother    Cancer Father    Breast cancer Neg Hx     Social History   Socioeconomic History   Marital status: Married    Spouse name: Not on file   Number of children: Not on file   Years of education: Not on file   Highest education level: Not on file  Occupational History   Not on file  Tobacco Use   Smoking status: Never   Smokeless tobacco: Never  Vaping Use   Vaping status: Never Used  Substance and Sexual Activity   Alcohol use: No   Drug use: No   Sexual activity: Not on file  Other Topics Concern   Not on file  Social History Narrative   Not on file   Social Drivers of Health   Financial Resource Strain: Low Risk  (09/11/2023)   Received from [facility]   Overall Financial Resource Strain (CARDIA)    Difficulty of Paying Living Expenses: Not hard at all  Food Insecurity: Unknown (09/11/2023)   Received from [facility]   Hunger Vital Sign    Worried About Running Out of Food in the Last Year: Patient declined    Ran Out of Food in the Last Year: Never true   Transportation Needs: No Transportation Needs (09/11/2023)   Received from [facility] - Transportation    In the past 12 months, has lack of transportation kept you from medical appointments or from getting medications?: No    Lack of Transportation (Non-Medical): No  Physical Activity: Not on file  Stress: Not on file  Social Connections: Not on file  Intimate Partner Violence: Not on file    Review of Systems  All other systems reviewed and are negative.       Objective    BP (!) 200/84   Pulse 67   Ht 5\' 3"  (1.6 m)   Wt 198 lb 3.2 oz (  89.9 kg)   SpO2 97%   BMI 35.11 kg/m   Physical Exam Vitals and nursing note reviewed.  Constitutional:      Appearance: Normal appearance.  HENT:     Head: Normocephalic.     Right Ear: External ear normal.     Left Ear: External ear normal.  Eyes:     Conjunctiva/sclera: Conjunctivae normal.  Cardiovascular:     Rate and Rhythm: Normal rate.  Pulmonary:     Effort: Pulmonary effort is normal. No respiratory distress.  Abdominal:     Palpations: Abdomen is soft.  Musculoskeletal:        General: Normal range of motion.  Skin:    General: Skin is warm.  Neurological:     Mental Status: She is alert and oriented to person, place, and time.  Psychiatric:        Mood and Affect: Mood normal.        Assessment & Plan:   Problem List Items Addressed This Visit       Cardiovascular and Mediastinum   Essential hypertension - Primary   Elevated.  Takes Amlodipine 2.5 mg every day , coreg 6.25 mg BID, Losartan 100 mg every day. She does not check BP. She has monitor at home. She is asymptomatic.   Increasing Coreg to 12.5 mg BID, amlodipine 5 MG every day , CONTINUE SAME DOSE : Losartan 100 mg every day , will consider adding hygroton based on her home BP readings.        Relevant Medications   carvedilol (COREG) 12.5 MG tablet   amLODipine (NORVASC) 5 MG tablet     Respiratory   COPD  (chronic obstructive pulmonary disease) (HCC)     Genitourinary   Chronic kidney disease, stage 3 unspecified (HCC)     Other   Hyperlipidemia   Relevant Medications   carvedilol (COREG) 12.5 MG tablet   amLODipine (NORVASC) 5 MG tablet   Obesity, morbid (HCC)   Prediabetes    Return in about 2 weeks (around 05/22/2024).   Vinary K Autry Prust, MD

## 2024-05-27 ENCOUNTER — Encounter: Payer: Self-pay | Admitting: Family Medicine

## 2024-05-27 ENCOUNTER — Ambulatory Visit (INDEPENDENT_AMBULATORY_CARE_PROVIDER_SITE_OTHER): Admitting: Family Medicine

## 2024-05-27 VITALS — BP 170/75 | HR 73 | Ht 63.0 in | Wt 192.0 lb

## 2024-05-27 DIAGNOSIS — N1832 Chronic kidney disease, stage 3b: Secondary | ICD-10-CM | POA: Diagnosis not present

## 2024-05-27 DIAGNOSIS — I10 Essential (primary) hypertension: Secondary | ICD-10-CM

## 2024-05-27 DIAGNOSIS — R7303 Prediabetes: Secondary | ICD-10-CM | POA: Diagnosis not present

## 2024-05-27 MED ORDER — EMPAGLIFLOZIN 10 MG PO TABS: 10.0000 mg | ORAL_TABLET | Freq: Every day | ORAL | Status: AC

## 2024-05-27 NOTE — Patient Instructions (Signed)
 Please see GI doctor on 07/07/2024 at 1:30 PM. Arrive at 1:15 PM for check in. Address is listed below.  Dr. Emerick Hanlon At [facility] in [place], [place] [address] [place] [address]

## 2024-05-27 NOTE — Progress Notes (Signed)
   Established Patient Office Visit  Subjective   Patient ID: Becky Reese, female    DOB: 1946/10/05  Age: 78 y.o. MRN: [id number]  Chief Complaint  Patient presents with   Hypertension    High readings at home     78 year old female with PMH of CKD 3b, HTN, Prediabetes, Subclavian artery stenosis, carotid stenosis, COPD, obesity, presents for hypertension follow up.   Reports she is feeling better. Has been tyring cut salt use, eating veggies and she is excercising.   She checks BP at home systolic : 160 - 409; Diastolic 70.  Feels stress may be  contributing to her BP.     Review of Systems  All other systems reviewed and are negative.     Objective:     BP (!) 170/75   Pulse 73   Ht 5\' 3"  (1.6 m)   Wt 192 lb (87.1 kg)   SpO2 98%   BMI 34.01 kg/m    Physical Exam Vitals and nursing note reviewed.  Constitutional:      Appearance: Normal appearance.  HENT:     Head: Normocephalic.     Right Ear: External ear normal.     Left Ear: External ear normal.  Eyes:     Conjunctiva/sclera: Conjunctivae normal.  Cardiovascular:     Rate and Rhythm: Normal rate.  Pulmonary:     Effort: Pulmonary effort is normal. No respiratory distress.  Abdominal:     Palpations: Abdomen is soft.  Musculoskeletal:        General: Normal range of motion.  Skin:    General: Skin is warm.  Neurological:     Mental Status: She is alert and oriented to person, place, and time.  Psychiatric:        Mood and Affect: Mood normal.      No results found for any visits on 05/27/24.      The ASCVD Risk score (Arnett DK, et al., 2019) failed to calculate for the following reasons:   The 2019 ASCVD risk score is only valid for ages 53 to 18    Assessment & Plan:   Problem List Items Addressed This Visit       Cardiovascular and Mediastinum   Essential hypertension - Primary   Goal <140/90, Not well controlled,  Will add Jardiance 10 mg every day, continue Coreg  12.5  mg BID, Norvasc  5 mg every day , Losartan 100 mg every day.  One week Jardiance sample given.  She will follow up with Nephrology on 16/6/25. RTC in a month.      Relevant Medications   empagliflozin (JARDIANCE) 10 MG TABS tablet     Genitourinary   Chronic kidney disease, stage 3 unspecified (HCC)   Relevant Medications   empagliflozin (JARDIANCE) 10 MG TABS tablet     Other   Prediabetes    Return in about 4 weeks (around 06/24/2024).    Vinary K Tanetta Fuhriman, MD

## 2024-05-27 NOTE — Assessment & Plan Note (Signed)
 Goal <140/90, Not well controlled,  Will add Jardiance 10 mg every day, continue Coreg  12.5 mg BID, Norvasc  5 mg every day , Losartan 100 mg every day.  One week Jardiance sample given.  She will follow up with Nephrology on 16/6/25. RTC in a month.

## 2024-06-24 ENCOUNTER — Encounter: Payer: Self-pay | Admitting: Family Medicine

## 2024-06-24 ENCOUNTER — Ambulatory Visit (INDEPENDENT_AMBULATORY_CARE_PROVIDER_SITE_OTHER): Admitting: Family Medicine

## 2024-06-24 VITALS — BP 155/74 | HR 81 | Ht 63.0 in | Wt 194.0 lb

## 2024-06-24 DIAGNOSIS — I771 Stricture of artery: Secondary | ICD-10-CM | POA: Diagnosis not present

## 2024-06-24 DIAGNOSIS — M85859 Other specified disorders of bone density and structure, unspecified thigh: Secondary | ICD-10-CM

## 2024-06-24 DIAGNOSIS — I6523 Occlusion and stenosis of bilateral carotid arteries: Secondary | ICD-10-CM

## 2024-06-24 DIAGNOSIS — J42 Unspecified chronic bronchitis: Secondary | ICD-10-CM

## 2024-06-24 DIAGNOSIS — N1832 Chronic kidney disease, stage 3b: Secondary | ICD-10-CM

## 2024-06-24 DIAGNOSIS — E782 Mixed hyperlipidemia: Secondary | ICD-10-CM

## 2024-06-24 NOTE — Assessment & Plan Note (Signed)
 Stable. [name] Creatinine 1.53, eGFR: 34.  Follows Nephrology.

## 2024-06-24 NOTE — Assessment & Plan Note (Signed)
 The BMD measured at Femur Neck Right is 0.813 g/cm2 with a T-score of -1.6.  Due for Bone scan. Order placed.

## 2024-06-24 NOTE — Assessment & Plan Note (Signed)
 Stable, CCM.SABRA Ex smoker, Quit when she was in 28 Breo 200mcg /25 mcg 1 puff every day  Reminded oral rinse after each use.

## 2024-06-24 NOTE — Assessment & Plan Note (Addendum)
 Follows Vascular, gets yearly carotid artery US  yearly.  Summary: Right Carotid: Velocities in the right ICA are consistent with a 40-59% stenosis.  Left Carotid: Velocities in the left ICA are consistent with a 1-39% stenosis.  Vertebrals: Right vertebral artery demonstrates antegrade flow. Left vertebral artery demonstrates retrograde flow. Subclavians: Normal flow hemodynamics were seen in bilateral subclavian arteries.   Compliant with Pravachol and zetia.

## 2024-06-24 NOTE — Assessment & Plan Note (Signed)
 On Pravastatin 20 mg every day, Has 3 refills, 01/30/2025 PREVIOUS pcp) Zetia  Has 2 refill 02/09/2025 ( previous PCP)

## 2024-06-24 NOTE — Progress Notes (Signed)
 Established Patient Office Visit  Subjective   Patient ID: Becky Reese, female    DOB: 02-May-1946  Age: 78 y.o. MRN: [id number]  Chief Complaint  Patient presents with   Hypertension     Assessment & Plan:   Problem List Items Addressed This Visit       Cardiovascular and Mediastinum   Carotid stenosis, asymptomatic, bilateral   Follows Vascular, gets yearly carotid artery US  yearly.  Summary: Right Carotid: Velocities in the right ICA are consistent with a 40-59% stenosis.  Left Carotid: Velocities in the left ICA are consistent with a 1-39% stenosis.  Vertebrals: Right vertebral artery demonstrates antegrade flow. Left vertebral artery demonstrates retrograde flow. Subclavians: Normal flow hemodynamics were seen in bilateral subclavian arteries.   Compliant with Pravachol and zetia.       RESOLVED: Subclavian artery stenosis (HCC)   Summary: Right Carotid: Velocities in the right ICA are consistent with a 40-59% stenosis.  Left Carotid: Velocities in the left ICA are consistent with a 1-39% stenosis.  Vertebrals: Right vertebral artery demonstrates antegrade flow. Left vertebral artery demonstrates retrograde flow. Subclavians: Normal flow hemodynamics were seen in bilateral subclavian arteries.         Respiratory   COPD (chronic obstructive pulmonary disease) (HCC)   Stable, CCM.Becky Reese Ex smoker, Quit when she was in 28 Breo 200mcg /25 mcg 1 puff every day  Reminded oral rinse after each use.         Musculoskeletal and Integument   Osteopenia - Primary   The BMD measured at Femur Neck Right is 0.813 g/cm2 with a T-score of -1.6.  Due for Bone scan. Order placed.      Relevant Orders   HM DEXA SCAN (Completed)     Genitourinary   Chronic kidney disease, stage 3 unspecified (HCC)   Stable. [name] Creatinine 1.53, eGFR: 34.  Follows Nephrology.          Other   Hyperlipidemia   On Pravastatin 20 mg every day, Has 3 refills, 01/30/2025  PREVIOUS pcp) Zetia  Has 2 refill 02/09/2025 ( previous PCP)       Return in about 6 months (around 12/25/2024) for chronic follow up with PCP.   Here for follow up.   HTN : Home BP:  130/69-  155/74 BP Readings from Last 3 Encounters: 06/24/24 : (!) 155/74 05/27/24 : (!) 170/75 05/08/24 : (!) 200/84   Stable, she restarted taking amlodipine . Denies any side effects.  She did try Jardiance  sample for few days but stopped taking it as she felt off. She forgot to mention it during her nephrology visit. Losartan 100 mg every day  Has 3 refill 03/09/2025 ( previous PCP) Amlodipine    2.5 mg every day .  Has 3 refill 03/09/2025 (Nepherologist) Coreg  12.5 mg BID ,  Has 3 refill 03/09/2025 ( previous PCP)  HLD:  LDL: 99 Pravastatin 20 mg every day, Has 3 refills, 01/30/2025 PREVIOUS pcp) Zetia  Has 2 refill 02/09/2025 ( previous PCP)  Seasonal allergies: Cetrizine 10 mg every day  COPD:  Ex smoker, Quit when she was in 28 Breo 200mcg /25 mcg 1 puff every day  Reminded oral rinse after each use.             Review of Systems  All other systems reviewed and are negative.     Objective:     BP (!) 155/74   Pulse 81   Ht 5' 3 (1.6 m)   Wt 194 lb (  88 kg)   SpO2 98%   BMI 34.37 kg/m    Physical Exam Vitals and nursing note reviewed.  Constitutional:      Appearance: Normal appearance.  HENT:     Head: Normocephalic.     Right Ear: External ear normal.     Left Ear: External ear normal.   Eyes:     Conjunctiva/sclera: Conjunctivae normal.    Cardiovascular:     Rate and Rhythm: Normal rate.  Pulmonary:     Effort: Pulmonary effort is normal.  Abdominal:     Palpations: Abdomen is soft.   Musculoskeletal:        General: Normal range of motion.   Skin:    General: Skin is warm.   Neurological:     Mental Status: She is alert and oriented to person, place, and time.   Psychiatric:        Mood and Affect: Mood normal.      No results found for  any visits on 06/24/24.    The ASCVD Risk score (Arnett DK, et al., 2019) failed to calculate for the following reasons:   The 2019 ASCVD risk score is only valid for ages 39 to 32      Becky MARLA Ny, MD

## 2024-06-24 NOTE — Assessment & Plan Note (Signed)
 Summary: Right Carotid: Velocities in the right ICA are consistent with a 40-59% stenosis.  Left Carotid: Velocities in the left ICA are consistent with a 1-39% stenosis.  Vertebrals: Right vertebral artery demonstrates antegrade flow. Left vertebral artery demonstrates retrograde flow. Subclavians: Normal flow hemodynamics were seen in bilateral subclavian arteries.

## 2024-07-29 ENCOUNTER — Telehealth: Payer: Self-pay

## 2024-07-29 ENCOUNTER — Encounter: Payer: Self-pay | Admitting: Family Medicine

## 2024-07-29 ENCOUNTER — Other Ambulatory Visit: Payer: Self-pay

## 2024-07-29 DIAGNOSIS — M85859 Other specified disorders of bone density and structure, unspecified thigh: Secondary | ICD-10-CM

## 2024-07-29 NOTE — Telephone Encounter (Signed)
 Spoke with [facility] regarding patient's DEXA scan order. They do not have an order on file for her.  Please place order for DEXA scan.

## 2024-07-29 NOTE — Telephone Encounter (Signed)
 Copied from CRM [id number]. Topic: Referral - Status >> Jul 29, 2024  3:33 PM Nathanel BROCKS wrote: Reason for CRM: pt called and is checking on the status of the bone density test that you was going to order. Please call pt and advise. Pt called the facility and they said that they havent received one for her.

## 2024-08-04 ENCOUNTER — Other Ambulatory Visit: Payer: Self-pay | Admitting: Family Medicine

## 2024-08-04 DIAGNOSIS — M85859 Other specified disorders of bone density and structure, unspecified thigh: Secondary | ICD-10-CM

## 2024-08-04 NOTE — Telephone Encounter (Signed)
 Dexa order placed

## 2024-08-04 NOTE — Telephone Encounter (Signed)
 Patient has been made aware and phone number to [facility] breast care center provided.

## 2024-08-28 ENCOUNTER — Other Ambulatory Visit: Payer: Self-pay | Admitting: Family Medicine

## 2024-08-28 DIAGNOSIS — I10 Essential (primary) hypertension: Secondary | ICD-10-CM

## 2024-08-28 NOTE — Telephone Encounter (Signed)
 Please review medication refill request

## 2024-08-28 NOTE — Telephone Encounter (Signed)
 Requested medication (s) are due for refill today: yes  Requested medication (s) are on the active medication list: yes  Last refill:  05/08/24 #60 tabs 3 RF  Future visit scheduled: yes  Notes to clinic:  overdue labs   Requested Prescriptions  Pending Prescriptions Disp Refills   carvedilol  (COREG ) 12.5 MG tablet [Pharmacy Med Name: CARVEDILOL  12.5 MG TABLET] 60 tablet 3    Sig: TAKE 1 TABLET (12.5MG  TOTAL) BY MOUTH TWICE A DAY WITH MEALS     Cardiovascular: Beta Blockers 3 Failed - 08/28/2024  1:54 PM      Failed - Cr in normal range and within 360 days    No results found for: CREATININE, LABCREAU, LABCREA, POCCRE       Failed - AST in normal range and within 360 days    No results found for: POCAST, AST       Failed - ALT in normal range and within 360 days    No results found for: ALT, LABALT, POCALT       Failed - Last BP in normal range    BP Readings from Last 1 Encounters:  06/24/24 (!) 155/74         Passed - Last Heart Rate in normal range    Pulse Readings from Last 1 Encounters:  06/24/24 81         Passed - Valid encounter within last 6 months    Recent Outpatient Visits           2 months ago Osteopenia of neck of femur, unspecified laterality   [facility] at [facility] Kotturi, Vinay K, MD   3 months ago Essential hypertension   [facility] at [facility] Kotturi, Vinay K, MD   3 months ago Essential hypertension   [facility] at [facility] Beaver, Vinay K, MD

## 2024-09-02 ENCOUNTER — Telehealth: Payer: Self-pay

## 2024-09-02 MED ORDER — COVID-19 MRNA VAC-TRIS(PFIZER) 30 MCG/0.3ML IM SUSY: 0.3000 mL | PREFILLED_SYRINGE | Freq: Once | INTRAMUSCULAR | 0 refills | Status: AC

## 2024-09-02 NOTE — Telephone Encounter (Signed)
 Copied from CRM [id number]. Topic: Clinical - Medication Prior Auth >> Sep 02, 2024  9:49 AM Jasmin G wrote: Reason for CRM: Pt called to request a prescription from her PCP, Dr. Sol for a covid shot sent to [id number] - [place], [place] - [address], Phone: [phone] Fax: [phone].

## 2024-09-08 ENCOUNTER — Ambulatory Visit
Admission: RE | Admit: 2024-09-08 | Discharge: 2024-09-08 | Disposition: A | Discharge status: Home / Self Care | Source: Ambulatory Visit | Attending: Family Medicine | Admitting: Family Medicine

## 2024-09-08 DIAGNOSIS — M8589 Other specified disorders of bone density and structure, multiple sites: Secondary | ICD-10-CM | POA: Insufficient documentation

## 2024-09-08 DIAGNOSIS — M85859 Other specified disorders of bone density and structure, unspecified thigh: Secondary | ICD-10-CM | POA: Diagnosis present

## 2024-09-08 DIAGNOSIS — Z13820 Encounter for screening for osteoporosis: Secondary | ICD-10-CM | POA: Insufficient documentation

## 2024-09-09 ENCOUNTER — Ambulatory Visit: Payer: Self-pay | Admitting: Family Medicine

## 2024-09-09 NOTE — Progress Notes (Signed)
 Your bone scan shows osteopenia, means weak bones.  Please make sure you take calcium and vitamin D. There are other medications which are recommended, in order to discuss please schedule a visit.  Dr.K

## 2024-09-11 NOTE — Progress Notes (Signed)
 She needs to take Calcium 1200 mg every day with vitamin D  of 800 international units DAILY.

## 2024-09-11 NOTE — Telephone Encounter (Signed)
 Copied from CRM [id number]. Topic: Clinical - Medication Question >> Sep 11, 2024  2:57 PM Carlatta H wrote: Reason for CRM: Patient would like to know if she take a calcium with vitamin D in it along with a Vitamin D supplement

## 2024-09-23 ENCOUNTER — Telehealth: Payer: Self-pay | Admitting: Family Medicine

## 2024-09-23 NOTE — Telephone Encounter (Signed)
 Copied from CRM [id number]. Topic: Medicare AWV >> Sep 23, 2024  9:40 AM Nathanel DEL wrote: Reason for CRM: Called LVM 09/23/2024 to schedule AWV. Please schedule office or virtual visits.  Nathanel Paschal; Care Guide Ambulatory Clinical Support [place] [facility] Direct Dial: [phone]

## 2024-10-01 ENCOUNTER — Other Ambulatory Visit (INDEPENDENT_AMBULATORY_CARE_PROVIDER_SITE_OTHER): Payer: Self-pay | Admitting: Vascular Surgery

## 2024-10-01 DIAGNOSIS — I6523 Occlusion and stenosis of bilateral carotid arteries: Secondary | ICD-10-CM

## 2024-10-01 NOTE — Progress Notes (Unsigned)
 MRN : [id number]  Becky Reese is a 78 y.o. (August 09, 1946) female who presents with chief complaint of check carotid arteries.  History of Present Illness:   The patient is seen for follow up evaluation of carotid stenosis. The carotid stenosis followed by ultrasound.    The patient denies amaurosis fugax. There is no recent history of TIA symptoms or focal motor deficits. There is no prior documented CVA.   The patient is taking enteric-coated aspirin 81 mg daily.   There is no history of migraine headaches. There is no history of seizures.   The patient has a history of coronary artery disease, no recent episodes of angina or shortness of breath. The patient denies PAD or claudication symptoms. There is a history of hyperlipidemia which is being treated with a statin.     Carotid Duplex done today shows RICA=40-59% and LICA 40-59% (previous study RICA=40-59% and LICA =<40%)  No outpatient medications have been marked as taking for the 10/02/24 encounter (Appointment) with Jama, Cordella MATSU, MD.    Past Medical History:  Diagnosis Date   Asthma    Chronic kidney disease    Diabetes mellitus without complication (HCC)    Hemorrhoids    Hyperlipidemia    Hypertension    Murmur, cardiac    Osteopenia     Past Surgical History:  Procedure Laterality Date   ABDOMINAL HYSTERECTOMY     PARTIAL   BREAST CYST EXCISION Right    NEG   COLONOSCOPY     COLONOSCOPY WITH PROPOFOL  N/A 01/29/2019   Procedure: COLONOSCOPY WITH PROPOFOL ;  Surgeon: Viktoria Lamar DASEN, MD;  Location: [facility] ENDOSCOPY;  Service: Endoscopy;  Laterality: N/A;   ESOPHAGOGASTRODUODENOSCOPY (EGD) WITH PROPOFOL  N/A 01/29/2019   Procedure: ESOPHAGOGASTRODUODENOSCOPY (EGD) WITH PROPOFOL ;  Surgeon: Viktoria Lamar DASEN, MD;  Location: [facility] ENDOSCOPY;  Service: Endoscopy;  Laterality: N/A;    Social History Social History   Tobacco Use   Smoking status: Never   Smokeless tobacco: Never   Vaping Use   Vaping status: Never Used  Substance Use Topics   Alcohol use: No   Drug use: No    Family History Family History  Problem Relation Age of Onset   Cancer Mother    Cancer Father    Breast cancer Neg Hx     Allergies  Allergen Reactions   Amlodipine  Other (See Comments)    Swelling of ankle and feet with higher doses 10mg    Atorvastatin     Muscle pain  Other reaction(s): Muscle Pain, Other (see comments) Muscle pain Muscle pain   Rosuvastatin     Muscle Pain  Other reaction(s): Muscle Pain, Other (see comments) Muscle Pain Muscle Pain   Simvastatin     Other reaction(s): Muscle Pain, Other (see comments)   Penicillins Hives    Hives  NDC Code: 218386405 Hives Hives   Sulfa Antibiotics Hives    NDC Code: 814924298     REVIEW OF SYSTEMS (Negative unless checked)  Constitutional: [] Weight loss  [] Fever  [] Chills Cardiac: [] Chest pain   [] Chest pressure   [] Palpitations   [] Shortness of breath when laying flat   [] Shortness of breath with exertion. Vascular:  [x] Pain in legs with walking   [] Pain in legs at rest  [] History of DVT   [] Phlebitis   [] Swelling in legs   [] Varicose veins   [] Non-healing ulcers  Pulmonary:   [] Uses home oxygen   [] Productive cough   [] Hemoptysis   [] Wheeze  [] COPD   [] Asthma Neurologic:  [] Dizziness   [] Seizures   [] History of stroke   [] History of TIA  [] Aphasia   [] Vissual changes   [] Weakness or numbness in arm   [] Weakness or numbness in leg Musculoskeletal:   [] Joint swelling   [] Joint pain   [] Low back pain Hematologic:  [] Easy bruising  [] Easy bleeding   [] Hypercoagulable state   [] Anemic Gastrointestinal:  [] Diarrhea   [] Vomiting  [] Gastroesophageal reflux/heartburn   [] Difficulty swallowing. Genitourinary:  [] Chronic kidney disease   [] Difficult urination  [] Frequent urination   [] Blood in urine Skin:  [] Rashes   [] Ulcers  Psychological:  [] History of anxiety   []  History of major depression.  Physical  Examination  There were no vitals filed for this visit. There is no height or weight on file to calculate BMI. Gen: WD/WN, NAD Head: [place]/AT, No temporalis wasting.  Ear/Nose/Throat: Hearing grossly intact, nares w/o erythema or drainage Eyes: PER, EOMI, sclera nonicteric.  Neck: Supple, no masses.  No bruit or JVD.  Pulmonary:  Good air movement, no audible wheezing, no use of accessory muscles.  Cardiac: RRR, normal S1, S2, no Murmurs. Vascular:  carotid bruit noted Vessel Right Left  Radial Palpable Palpable  Carotid  Palpable  Palpable  Subclav  Palpable Palpable  Gastrointestinal: soft, non-distended. No guarding/no peritoneal signs.  Musculoskeletal: M/S 5/5 throughout.  No visible deformity.  Neurologic: CN 2-12 intact. Pain and light touch intact in extremities.  Symmetrical.  Speech is fluent. Motor exam as listed above. Psychiatric: Judgment intact, Mood & affect appropriate for pt's clinical situation. Dermatologic: No rashes or ulcers noted.  No changes consistent with cellulitis.   CBC No results found for: WBC, HGB, HCT, MCV, PLT  BMET No results found for: NA, K, CL, CO2, GLUCOSE, BUN, CREATININE, CALCIUM, GFRNONAA, GFRAA CrCl cannot be calculated (No successful lab value found.).  COAG No results found for: INR, PROTIME  Radiology DG Bone Density Result Date: 09/08/2024 EXAM: DUAL X-RAY ABSORPTIOMETRY (DXA) FOR BONE MINERAL DENSITY 09/08/2024 8:46 am CLINICAL DATA:  78 year old Female Postmenopausal. Osteopenia History of fragility fracture. TECHNIQUE: An axial (e.g., hips, spine) and/or appendicular (e.g., radius) exam was performed, as appropriate, using GE Secretary/administrator at [employer]. Images are obtained for bone mineral density measurement and are not obtained for diagnostic purposes. [id number] Exclusions: Lumbar spine due to advanced degenerative changes. COMPARISON:  05/25/2022. FINDINGS: Scan  quality: Good. LEFT FEMORAL NECK: BMD (in g/cm2): 0.808 T-score: -1.7 Z-score: -0.4 LEFT TOTAL HIP: BMD (in g/cm2): 0.904 T-score: -0.8 Z-score: 0.2 RIGHT FEMORAL NECK: BMD (in g/cm2): 0.792 T-score: -1.8 Z-score: -0.5 RIGHT TOTAL HIP: BMD (in g/cm2): 0.853 T-score: -1.2 Z-score: -0.2 DUAL-FEMUR TOTAL MEAN: Rate of change from previous exam: No significant rate of change from previous exam. LEFT FOREARM (RADIUS 33%): BMD (in g/cm2): 0.801 T-score: -0.9 Z-score: 1.2 Rate of change from previous exam: No significant rate of change from previous exam. FRAX 10-YEAR PROBABILITY OF FRACTURE: 10-year fracture risk is performed using the [facility] calculator based on patient-reported risk factors. Major osteoporotic fracture: 9.2% Hip fracture: 2.1% Other situations known to alter the reliability of the FRAX score should be considered when making treatment decisions, including chronic glucocorticoid use and past treatments. Further guidance on treatment can be found at the [facility]oporosis Foundation's website https://www.patton.com/. IMPRESSION: Osteopenia based on BMD. Fracture risk is increased.  Increased risk is based on low BMD and history of fragility fracture. RECOMMENDATIONS: 1. All patients should optimize calcium and vitamin D intake. 2. Consider FDA-approved medical therapies in postmenopausal women and men aged 85 years and older, based on the following: - A hip or vertebral (clinical or morphometric) fracture - T-score less than or equal to -2.5 and secondary causes have been excluded. - Low bone mass (T-score between -1.0 and -2.5) and a 10-year probability of a hip fracture greater than or equal to 3% or a 10-year probability of a major osteoporosis-related fracture greater than or equal to 20% based on the [place] -adapted WHO algorithm. - Clinician judgment and/or patient preferences may indicate treatment for people with 10-year fracture probabilities above or below these levels 3. Patients with  diagnosis of osteoporosis or at high risk for fracture should have regular bone mineral density tests. For patients eligible for Medicare, routine testing is allowed once every 2 years. The testing frequency can be increased to one year for patients who have rapidly progressing disease, those who are receiving or discontinuing medical therapy to restore bone mass, or have additional risk factors. Electronically Signed   By: Dina  Arceo M.D.   On: 09/08/2024 12:20     Assessment/Plan There are no diagnoses linked to this encounter.   Cordella Shawl, MD  10/01/2024 8:11 AM

## 2024-10-02 ENCOUNTER — Ambulatory Visit (INDEPENDENT_AMBULATORY_CARE_PROVIDER_SITE_OTHER): Payer: Medicare HMO | Admitting: Vascular Surgery

## 2024-10-02 ENCOUNTER — Encounter (INDEPENDENT_AMBULATORY_CARE_PROVIDER_SITE_OTHER): Payer: Self-pay | Admitting: Vascular Surgery

## 2024-10-02 ENCOUNTER — Other Ambulatory Visit (INDEPENDENT_AMBULATORY_CARE_PROVIDER_SITE_OTHER): Payer: Medicare HMO

## 2024-10-02 VITALS — BP 200/85 | HR 67 | Resp 18 | Wt 196.0 lb

## 2024-10-02 DIAGNOSIS — E782 Mixed hyperlipidemia: Secondary | ICD-10-CM

## 2024-10-02 DIAGNOSIS — I10 Essential (primary) hypertension: Secondary | ICD-10-CM

## 2024-10-02 DIAGNOSIS — J42 Unspecified chronic bronchitis: Secondary | ICD-10-CM | POA: Diagnosis not present

## 2024-10-02 DIAGNOSIS — I6523 Occlusion and stenosis of bilateral carotid arteries: Secondary | ICD-10-CM

## 2024-10-07 ENCOUNTER — Ambulatory Visit: Admit: 2024-10-07 | Admitting: Gastroenterology

## 2024-10-07 SURGERY — COLONOSCOPY
Anesthesia: General

## 2024-12-24 ENCOUNTER — Ambulatory Visit: Admitting: Family Medicine

## 2025-01-29 ENCOUNTER — Ambulatory Visit
Admission: RE | Admit: 2025-01-29 | Discharge: 2025-01-29 | Disposition: A | Discharge status: Home / Self Care | Source: Ambulatory Visit

## 2025-01-29 VITALS — BP 99/66 | HR 83 | Temp 98.0°F | Resp 15 | Wt 185.6 lb

## 2025-01-29 DIAGNOSIS — R0981 Nasal congestion: Secondary | ICD-10-CM | POA: Diagnosis not present

## 2025-01-29 DIAGNOSIS — J0100 Acute maxillary sinusitis, unspecified: Secondary | ICD-10-CM

## 2025-01-29 MED ORDER — DOXYCYCLINE HYCLATE 100 MG PO CAPS: 100.0000 mg | ORAL_CAPSULE | Freq: Two times a day (BID) | ORAL | 0 refills | Status: AC

## 2025-01-29 NOTE — Discharge Instructions (Addendum)
 We are treating you for a sinus infection. Take antibiotic with food as prescribed Drink plenty of water Follow up with PCP next week if no improvement

## 2025-01-29 NOTE — ED Provider Notes (Signed)
 " [facility]    CSN: [id number] Arrival date & time: 01/29/25  1338      History   Chief Complaint Chief Complaint  Patient presents with   Nasal Congestion   Appointment    HPI Becky Reese is a 79 y.o. female.   79 year old female, Becky Reese, presents to urgent care for evaluation of possible sinus infection.  Patient states she has had nasal congestion drainage headache and facial pain x 2 weeks.  Patient has been using saline nasal spray and hot compresses without relief.    The history is provided by the patient. No language interpreter was used.    Past Medical History:  Diagnosis Date   Asthma    Chronic kidney disease    Diabetes mellitus without complication (HCC)    Hemorrhoids    Hyperlipidemia    Hypertension    Murmur, cardiac    Osteopenia     Patient Active Problem List   Diagnosis Date Noted   Acute maxillary sinusitis 01/29/2025   Nasal congestion 01/29/2025   Hypercalcemia 10/16/2019   Gastric erosions, unspecified ulcer chronicity 06/10/2019   Carotid stenosis, asymptomatic, bilateral 08/12/2017   Eczema 08/12/2017   Adverse effect of statin 06/21/2017   COPD (chronic obstructive pulmonary disease) (HCC) 04/23/2017   Essential hypertension 04/23/2017   Hyperlipidemia 04/23/2017   Chronic kidney disease, stage 3 unspecified (HCC) 03/08/2015   Asthma, mild persistent 03/10/2014   Cardiac murmur, unspecified 03/06/2014   Prediabetes 06/10/2013   Allergic rhinitis 05/09/2013   Atrophic vaginitis 05/09/2013   Iritis 05/09/2013   Obesity, morbid (HCC) 05/09/2013   Osteopenia 05/09/2013   Uveitis 05/09/2013    Past Surgical History:  Procedure Laterality Date   ABDOMINAL HYSTERECTOMY     PARTIAL   BREAST CYST EXCISION Right    NEG   COLONOSCOPY     COLONOSCOPY WITH PROPOFOL  N/A 01/29/2019   Procedure: COLONOSCOPY WITH PROPOFOL ;  Surgeon: Viktoria Lamar DASEN, MD;  Location: [facility] ENDOSCOPY;  Service: Endoscopy;   Laterality: N/A;   ESOPHAGOGASTRODUODENOSCOPY (EGD) WITH PROPOFOL  N/A 01/29/2019   Procedure: ESOPHAGOGASTRODUODENOSCOPY (EGD) WITH PROPOFOL ;  Surgeon: Viktoria Lamar DASEN, MD;  Location: [facility] ENDOSCOPY;  Service: Endoscopy;  Laterality: N/A;    OB History   No obstetric history on file.      Home Medications    Prior to Admission medications  Medication Sig Start Date End Date Taking? Authorizing Provider  doxycycline  (VIBRAMYCIN ) 100 MG capsule Take 1 capsule (100 mg total) by mouth 2 (two) times daily for 7 days. 01/29/25 02/05/25 Yes Iyannah Blake, Rilla, NP  albuterol  (PROVENTIL ) (2.5 MG/3ML) 0.083% nebulizer solution Take 2.5 mg by nebulization every 6 (six) hours as needed for wheezing or shortness of breath.    [provider]  amLODipine  (NORVASC ) 5 MG tablet Take 1 tablet (5 mg total) by mouth daily. 05/08/24   Kotturi, Vinay K, MD  aspirin 81 MG tablet Take 81 mg by mouth daily.    [provider]  BREO ELLIPTA 200-25 MCG/ACT AEPB 1 puff daily.    [provider]  Calcium Carb-Cholecalciferol (CALCIUM 600/VITAMIN D3 PO) Take by mouth daily.    [provider]  carboxymethylcellulose (REFRESH PLUS) 0.5 % SOLN 1 drop 3 (three) times daily as needed.    [provider]  carvedilol  (COREG ) 12.5 MG tablet TAKE 1 TABLET (12.5MG  TOTAL) BY MOUTH TWICE A DAY WITH MEALS 08/28/24   Kotturi, Mackey POUR, MD  cetirizine  (ZYRTEC ) 10 MG tablet Take 10  mg by mouth daily.    [provider]  Cholecalciferol (VITAMIN D) 125 MCG (5000 UT) CAPS Take by mouth. Patient not taking: Reported on 10/02/2024    [provider]  Coenzyme Q10 (COQ10) 50 MG CAPS Take by mouth.    [provider]  desoximetasone (TOPICORT) 0.25 % cream Apply 1 application  topically 2 (two) times daily.    [provider]  ezetimibe (ZETIA) 10 MG tablet Take 10 mg by mouth daily.    [provider]  ferrous sulfate 325 (65 FE) MG EC tablet Take 325  mg by mouth 3 (three) times daily with meals.    [provider]  fluticasone (FLONASE) 50 MCG/ACT nasal spray Place into both nostrils daily.    [provider]  losartan (COZAAR) 100 MG tablet Take 100 mg by mouth daily.    [provider]  Multiple Vitamin (MULTIVITAMIN) capsule Take 1 capsule by mouth daily. Patient not taking: Reported on 10/02/2024    [provider]  niacin 500 MG tablet Take 500 mg by mouth at bedtime.    [provider]  pravastatin (PRAVACHOL) 20 MG tablet Take 10 mg by mouth daily.     [provider]  tetrahydrozoline 0.05 % ophthalmic solution     [provider]  simvastatin (ZOCOR) 20 MG tablet Take 20 mg by mouth daily.  05/19/20  [provider]    Family History Family History  Problem Relation Age of Onset   Cancer Mother    Cancer Father    Breast cancer Neg Hx     Social History Social History[1]   Allergies   Amlodipine , Atorvastatin, Rosuvastatin, Simvastatin, Penicillins, and Sulfa antibiotics   Review of Systems Review of Systems  Constitutional:  Negative for fever.  HENT:  Positive for congestion, postnasal drip, sinus pressure and sinus pain.   All other systems reviewed and are negative.    Physical Exam Triage Vital Signs ED Triage Vitals  Encounter Vitals Group     BP      Girls Systolic BP Percentile      Girls Diastolic BP Percentile      Boys Systolic BP Percentile      Boys Diastolic BP Percentile      Pulse      Resp      Temp      Temp src      SpO2      Weight      Height      Head Circumference      Peak Flow      Pain Score      Pain Loc      Pain Education      Exclude from Growth Chart    No data found.  Updated Vital Signs BP 99/66   Pulse 83   Temp 98 F (36.7 C)   Resp 15   Wt 185 lb 9.6 oz (84.2 kg)   SpO2 96%   BMI 32.88 kg/m   Visual Acuity Right Eye Distance:   Left Eye Distance:   Bilateral Distance:     Right Eye Near:   Left Eye Near:    Bilateral Near:     Physical Exam Vitals and nursing note reviewed.  Constitutional:      General: She is not in acute distress.    Appearance: She is well-developed.  HENT:     Head: Normocephalic.     Right Ear: Tympanic membrane is retracted.  Left Ear: Tympanic membrane is retracted.     Nose: Mucosal edema and congestion present.     Right Sinus: Maxillary sinus tenderness present.     Left Sinus: Maxillary sinus tenderness present.     Mouth/Throat:     Lips: Pink.     Mouth: Mucous membranes are moist.     Pharynx: Oropharynx is clear. Postnasal drip present.  Eyes:     General: Lids are normal.     Conjunctiva/sclera: Conjunctivae normal.     Pupils: Pupils are equal, round, and reactive to light.  Neck:     Trachea: No tracheal deviation.  Cardiovascular:     Rate and Rhythm: Normal rate and regular rhythm.     Heart sounds: Normal heart sounds. No murmur heard. Pulmonary:     Effort: Pulmonary effort is normal.     Breath sounds: Normal breath sounds and air entry.  Abdominal:     General: Bowel sounds are normal.     Palpations: Abdomen is soft.     Tenderness: There is no abdominal tenderness.  Musculoskeletal:        General: Normal range of motion.     Cervical back: Normal range of motion.  Lymphadenopathy:     Cervical: No cervical adenopathy.  Skin:    General: Skin is warm and dry.     Findings: No rash.  Neurological:     General: No focal deficit present.     Mental Status: She is alert and oriented to person, place, and time.     GCS: GCS eye subscore is 4. GCS verbal subscore is 5. GCS motor subscore is 6.  Psychiatric:        Speech: Speech normal.        Behavior: Behavior normal. Behavior is cooperative.      UC Treatments / Results  Labs (all labs ordered are listed, but only abnormal results are displayed) Labs Reviewed - No data to display  EKG   Radiology No results  found.  Procedures Procedures (including critical care time)  Medications Ordered in UC Medications - No data to display  Initial Impression / Assessment and Plan / UC Course  I have reviewed the triage vital signs and the nursing notes.  Pertinent labs & imaging results that were available during my care of the patient were reviewed by me and considered in my medical decision making (see chart for details).    Discussed exam findings plan of care with patient :we are treating you for a sinus infection. Take antibiotic(doxycycline ) with food as prescribed Drink plenty of water Follow up with PCP next week if no improvement.  Patient verbalized understanding to this provider  Ddx: Maxillary sinusitis, nasal congestion,allergies,viral illness Final Clinical Impressions(s) / UC Diagnoses   Final diagnoses:  Acute maxillary sinusitis, recurrence not specified  Nasal congestion     Discharge Instructions      We are treating you for a sinus infection. Take antibiotic with food as prescribed Drink plenty of water Follow up with PCP next week if no improvement     ED Prescriptions     Medication Sig Dispense Auth. Provider   doxycycline  (VIBRAMYCIN ) 100 MG capsule Take 1 capsule (100 mg total) by mouth 2 (two) times daily for 7 days. 14 capsule Endrit Gittins, NP      PDMP not reviewed this encounter.     [1]  Social History Tobacco Use   Smoking status: Never   Smokeless tobacco: Never  Vaping  Use   Vaping status: Never Used  Substance Use Topics   Alcohol use: No   Drug use: No     Zohal Reny, Rilla, NP 01/29/25 1412  "

## 2025-01-29 NOTE — ED Triage Notes (Signed)
 Patient to Urgent Care with complaints of nasal congestion/ drainage/ headache/ facial pain.  Symptoms x2 weeks.   Meds: using a saline nasal spray/ hot compresses.

## 2025-10-05 ENCOUNTER — Encounter (INDEPENDENT_AMBULATORY_CARE_PROVIDER_SITE_OTHER)

## 2025-10-05 ENCOUNTER — Ambulatory Visit (INDEPENDENT_AMBULATORY_CARE_PROVIDER_SITE_OTHER): Admitting: Vascular Surgery
